# Patient Record
Sex: Male | Born: 1960 | Race: Black or African American | Hispanic: No | Marital: Single | Smoking: Former smoker
Health system: Southern US, Community
[De-identification: ages and names within clinical notes are randomized; demographics above are authoritative.]

## PROBLEM LIST (undated history)

## (undated) ENCOUNTER — Emergency Department (HOSPITAL_BASED_OUTPATIENT_CLINIC_OR_DEPARTMENT_OTHER): Payer: BLUE CROSS/BLUE SHIELD | Source: Home / Self Care

## (undated) ENCOUNTER — Ambulatory Visit (HOSPITAL_COMMUNITY): Admission: EM

## (undated) DIAGNOSIS — F329 Major depressive disorder, single episode, unspecified: Secondary | ICD-10-CM

## (undated) DIAGNOSIS — F1910 Other psychoactive substance abuse, uncomplicated: Secondary | ICD-10-CM

## (undated) DIAGNOSIS — M1990 Unspecified osteoarthritis, unspecified site: Secondary | ICD-10-CM

## (undated) DIAGNOSIS — F32A Depression, unspecified: Secondary | ICD-10-CM

## (undated) HISTORY — DX: Other psychoactive substance abuse, uncomplicated: F19.10

## (undated) HISTORY — DX: Major depressive disorder, single episode, unspecified: F32.9

## (undated) HISTORY — DX: Depression, unspecified: F32.A

---
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
## 2012-07-17 ENCOUNTER — Encounter (HOSPITAL_COMMUNITY): Payer: Self-pay | Admitting: Emergency Medicine

## 2012-07-17 ENCOUNTER — Emergency Department (HOSPITAL_COMMUNITY)
Admission: EM | Admit: 2012-07-17 | Discharge: 2012-07-18 | Disposition: A | Discharge status: Other Acute Inpatient Hospital | Payer: Self-pay | Attending: Emergency Medicine | Admitting: Emergency Medicine

## 2012-07-17 DIAGNOSIS — Z8739 Personal history of other diseases of the musculoskeletal system and connective tissue: Secondary | ICD-10-CM | POA: Insufficient documentation

## 2012-07-17 DIAGNOSIS — F3289 Other specified depressive episodes: Secondary | ICD-10-CM | POA: Insufficient documentation

## 2012-07-17 DIAGNOSIS — F17200 Nicotine dependence, unspecified, uncomplicated: Secondary | ICD-10-CM | POA: Insufficient documentation

## 2012-07-17 DIAGNOSIS — F329 Major depressive disorder, single episode, unspecified: Secondary | ICD-10-CM | POA: Insufficient documentation

## 2012-07-17 DIAGNOSIS — R45851 Suicidal ideations: Secondary | ICD-10-CM | POA: Insufficient documentation

## 2012-07-17 DIAGNOSIS — F1910 Other psychoactive substance abuse, uncomplicated: Secondary | ICD-10-CM | POA: Insufficient documentation

## 2012-07-17 HISTORY — DX: Unspecified osteoarthritis, unspecified site: M19.90

## 2012-07-17 LAB — RAPID URINE DRUG SCREEN, HOSP PERFORMED
Amphetamines: NOT DETECTED
Benzodiazepines: NOT DETECTED
Opiates: NOT DETECTED

## 2012-07-17 LAB — CBC
HCT: 43.1 % (ref 39.0–52.0)
Hemoglobin: 14.3 g/dL (ref 13.0–17.0)
MCHC: 33.2 g/dL (ref 30.0–36.0)
Platelets: 203 10*3/uL (ref 150–400)
RBC: 5.42 MIL/uL (ref 4.22–5.81)

## 2012-07-17 LAB — ACETAMINOPHEN LEVEL: Acetaminophen (Tylenol), Serum: <15 ug/mL (ref 10–30)

## 2012-07-17 LAB — COMPREHENSIVE METABOLIC PANEL
AST: 17 U/L (ref 0–37)
Albumin: 4 g/dL (ref 3.5–5.2)
Calcium: 9.4 mg/dL (ref 8.4–10.5)
Chloride: 103 mEq/L (ref 96–112)
Creatinine, Ser: 1.23 mg/dL (ref 0.50–1.35)
Total Protein: 7.6 g/dL (ref 6.0–8.3)

## 2012-07-17 NOTE — ED Notes (Signed)
PT. GIVEN [place] SANDWICH .

## 2012-07-17 NOTE — ED Notes (Signed)
PAPER SCRUBS /FOOTIES GIVEN TO PT, TOWELS/SOAP GIVEN TO PT. SECURITY PAGED TO WAND PT.  SITTER AT BEDSIDE .

## 2012-07-17 NOTE — ED Notes (Signed)
Pt given food and a Coke after discussing with [place], [place]

## 2012-07-17 NOTE — ED Notes (Signed)
Reports here for detox from ETOH, marijuana, and cocaine; reports feeling depressed and suicidal, lost his job; reports drinks gallon + of liquor per day; last drank today--this AM, last used cocaine yesterday, last used marijuana yesterday

## 2012-07-17 NOTE — ED Notes (Signed)
Notified Consulting civil engineer and [facility] that pt needs sitter

## 2012-07-18 ENCOUNTER — Inpatient Hospital Stay (HOSPITAL_COMMUNITY)
Admission: AD | Admit: 2012-07-18 | Discharge: 2012-07-25 | DRG: 897 | Disposition: A | Discharge status: Home / Self Care | Payer: Federal, State, Local not specified - Other | Source: Ambulatory Visit | Attending: Psychiatry | Admitting: Psychiatry

## 2012-07-18 DIAGNOSIS — M1990 Unspecified osteoarthritis, unspecified site: Secondary | ICD-10-CM | POA: Diagnosis present

## 2012-07-18 DIAGNOSIS — F1920 Other psychoactive substance dependence, uncomplicated: Secondary | ICD-10-CM | POA: Diagnosis present

## 2012-07-18 DIAGNOSIS — F1020 Alcohol dependence, uncomplicated: Secondary | ICD-10-CM | POA: Diagnosis present

## 2012-07-18 DIAGNOSIS — F329 Major depressive disorder, single episode, unspecified: Secondary | ICD-10-CM | POA: Diagnosis present

## 2012-07-18 DIAGNOSIS — F1419 Cocaine abuse with unspecified cocaine-induced disorder: Secondary | ICD-10-CM

## 2012-07-18 DIAGNOSIS — F1999 Other psychoactive substance use, unspecified with unspecified psychoactive substance-induced disorder: Principal | ICD-10-CM | POA: Diagnosis present

## 2012-07-18 MED ORDER — HYDROXYZINE HCL 25 MG PO TABS
25.0000 mg | ORAL_TABLET | Freq: Four times a day (QID) | ORAL | Status: AC | PRN
  Administered 2012-07-21: 25 mg via ORAL

## 2012-07-18 MED ORDER — LORAZEPAM 2 MG/ML IJ SOLN: 1.0000 mg | Freq: Four times a day (QID) | INTRAMUSCULAR | Status: DC | PRN

## 2012-07-18 MED ORDER — ALUM & MAG HYDROXIDE-SIMETH 200-200-20 MG/5ML PO SUSP: 30.0000 mL | ORAL | Status: DC | PRN

## 2012-07-18 MED ORDER — VITAMIN B-1 100 MG PO TABS
100.0000 mg | ORAL_TABLET | Freq: Every day | ORAL | Status: DC
  Administered 2012-07-19 – 2012-07-25 (×7): 100 mg via ORAL
  Filled 2012-07-18 (×8): qty 1

## 2012-07-18 MED ORDER — THIAMINE HCL 100 MG/ML IJ SOLN: 100.0000 mg | Freq: Every day | INTRAMUSCULAR | Status: DC

## 2012-07-18 MED ORDER — THIAMINE HCL 100 MG/ML IJ SOLN: 100.0000 mg | Freq: Once | INTRAMUSCULAR | Status: DC

## 2012-07-18 MED ORDER — FOLIC ACID 1 MG PO TABS
1.0000 mg | ORAL_TABLET | Freq: Every day | ORAL | Status: DC
  Administered 2012-07-18: 1 mg via ORAL
  Filled 2012-07-18 (×2): qty 1

## 2012-07-18 MED ORDER — ONDANSETRON 4 MG PO TBDP: 4.0000 mg | ORAL_TABLET | Freq: Four times a day (QID) | ORAL | Status: AC | PRN

## 2012-07-18 MED ORDER — POTASSIUM CHLORIDE CRYS ER 20 MEQ PO TBCR
20.0000 meq | EXTENDED_RELEASE_TABLET | Freq: Once | ORAL | Status: AC
  Administered 2012-07-18: 20 meq via ORAL
  Filled 2012-07-18: qty 1

## 2012-07-18 MED ORDER — LORAZEPAM 1 MG PO TABS: 1.0000 mg | ORAL_TABLET | Freq: Four times a day (QID) | ORAL | Status: DC | PRN

## 2012-07-18 MED ORDER — ADULT MULTIVITAMIN W/MINERALS CH
1.0000 | ORAL_TABLET | Freq: Every day | ORAL | Status: DC
  Administered 2012-07-18: 1 via ORAL
  Filled 2012-07-18: qty 1

## 2012-07-18 MED ORDER — LORAZEPAM 1 MG PO TABS
1.0000 mg | ORAL_TABLET | Freq: Three times a day (TID) | ORAL | Status: DC | PRN
  Filled 2012-07-18: qty 1

## 2012-07-18 MED ORDER — ZOLPIDEM TARTRATE 5 MG PO TABS: 5.0000 mg | ORAL_TABLET | Freq: Every evening | ORAL | Status: DC | PRN

## 2012-07-18 MED ORDER — CHLORDIAZEPOXIDE HCL 25 MG PO CAPS
25.0000 mg | ORAL_CAPSULE | Freq: Three times a day (TID) | ORAL | Status: AC
  Administered 2012-07-21 (×2): 25 mg via ORAL
  Filled 2012-07-18: qty 1

## 2012-07-18 MED ORDER — LOPERAMIDE HCL 2 MG PO CAPS: 2.0000 mg | ORAL_CAPSULE | ORAL | Status: AC | PRN

## 2012-07-18 MED ORDER — ADULT MULTIVITAMIN W/MINERALS CH
1.0000 | ORAL_TABLET | Freq: Every day | ORAL | Status: DC
  Administered 2012-07-19 – 2012-07-25 (×7): 1 via ORAL
  Filled 2012-07-18 (×8): qty 1

## 2012-07-18 MED ORDER — CHLORDIAZEPOXIDE HCL 25 MG PO CAPS
25.0000 mg | ORAL_CAPSULE | Freq: Every day | ORAL | Status: AC
  Administered 2012-07-23: 25 mg via ORAL
  Filled 2012-07-18: qty 1

## 2012-07-18 MED ORDER — VITAMIN B-1 100 MG PO TABS
100.0000 mg | ORAL_TABLET | Freq: Every day | ORAL | Status: DC
  Administered 2012-07-18: 100 mg via ORAL
  Filled 2012-07-18: qty 1

## 2012-07-18 MED ORDER — ONDANSETRON HCL 8 MG PO TABS: 4.0000 mg | ORAL_TABLET | Freq: Three times a day (TID) | ORAL | Status: DC | PRN

## 2012-07-18 MED ORDER — LORAZEPAM 1 MG PO TABS
0.0000 mg | ORAL_TABLET | Freq: Four times a day (QID) | ORAL | Status: DC
  Administered 2012-07-18 (×2): 1 mg via ORAL
  Filled 2012-07-18: qty 1

## 2012-07-18 MED ORDER — CHLORDIAZEPOXIDE HCL 25 MG PO CAPS
25.0000 mg | ORAL_CAPSULE | Freq: Four times a day (QID) | ORAL | Status: AC | PRN
  Administered 2012-07-20: 25 mg via ORAL
  Filled 2012-07-18: qty 1

## 2012-07-18 MED ORDER — ACETAMINOPHEN 325 MG PO TABS: 650.0000 mg | ORAL_TABLET | ORAL | Status: DC | PRN

## 2012-07-18 MED ORDER — CHLORDIAZEPOXIDE HCL 25 MG PO CAPS
25.0000 mg | ORAL_CAPSULE | ORAL | Status: AC
  Administered 2012-07-21 – 2012-07-22 (×2): 25 mg via ORAL
  Filled 2012-07-18 (×2): qty 1

## 2012-07-18 MED ORDER — LORAZEPAM 1 MG PO TABS: 0.0000 mg | ORAL_TABLET | Freq: Two times a day (BID) | ORAL | Status: DC

## 2012-07-18 MED ORDER — ACETAMINOPHEN 325 MG PO TABS
650.0000 mg | ORAL_TABLET | Freq: Four times a day (QID) | ORAL | Status: DC | PRN
  Administered 2012-07-23 – 2012-07-24 (×2): 650 mg via ORAL

## 2012-07-18 MED ORDER — CHLORDIAZEPOXIDE HCL 25 MG PO CAPS
25.0000 mg | ORAL_CAPSULE | Freq: Four times a day (QID) | ORAL | Status: AC
  Administered 2012-07-19 (×4): 25 mg via ORAL
  Filled 2012-07-18 (×3): qty 1

## 2012-07-18 MED ORDER — MAGNESIUM HYDROXIDE 400 MG/5ML PO SUSP: 30.0000 mL | Freq: Every day | ORAL | Status: DC | PRN

## 2012-07-18 MED ORDER — CHLORDIAZEPOXIDE HCL 25 MG PO CAPS
50.0000 mg | ORAL_CAPSULE | Freq: Once | ORAL | Status: DC
  Filled 2012-07-18 (×2): qty 1

## 2012-07-18 NOTE — ED Notes (Signed)
Gave pt a cup of ice and coke with peanut butter crackers and a bag of pretzels

## 2012-07-18 NOTE — ED Provider Notes (Signed)
History     CSN: [id number]  Arrival date & time 07/17/12  1758   First MD Initiated Contact with Patient 07/17/12 2359      Chief Complaint  Patient presents with  . Suicidal  . Detox     (Consider location/radiation/quality/duration/timing/severity/associated sxs/prior treatment) HPI Comments: Patient reports he has been very depressed, has been on a thirty day binge of alcohol, cocaine, and marijuana.  States over the past few days he began feeling suicidal and tried to kill himself by overdosing on drugs and alcohol.  States he feels physically drained and is a little disoriented (patient is from [place] and pulled off the highway to come here for help).  Has been having some pain with breathing for 3 days, which he associated with the excessive smoking he has been doing.  Also feels "shaky."  Denies CP, SOB, abdominal pain, N/V/D.  Patient states he is tired of having to repeat himself.  Per nurse, patient has been drinking over a gallon of liquor per day.  Last drink was this morning.    The history is provided by the patient.    Past Medical History  Diagnosis Date  . Arthritis     Past Surgical History  Procedure Date  . Tonsillectomy     History reviewed. No pertinent family history.  History  Substance Use Topics  . Smoking status: Current Some Day Smoker  . Smokeless tobacco: Not on file  . Alcohol Use: Yes     heavy ETOH      Review of Systems  Respiratory: Negative for cough and shortness of breath.   Cardiovascular: Negative for chest pain.  Gastrointestinal: Negative for nausea, vomiting, abdominal pain and diarrhea.  Psychiatric/Behavioral: Positive for suicidal ideas and dysphoric mood.    Allergies  Aspirin  Home Medications  No current outpatient prescriptions on file.  BP 122/78  Pulse 72  Temp 98.3 F (36.8 C) (Oral)  Resp 14  SpO2 98%  Physical Exam  Nursing note and vitals reviewed. Constitutional: He appears well-developed and  well-nourished. No distress.  HENT:  Head: Normocephalic and atraumatic.  Neck: Neck supple.  Cardiovascular: Normal rate, regular rhythm, normal heart sounds and intact distal pulses.   Pulmonary/Chest: Effort normal and breath sounds normal. No respiratory distress. He has no wheezes. He has no rales.  Abdominal: Soft. He exhibits no distension and no mass. There is no tenderness. There is no rebound and no guarding.  Musculoskeletal: He exhibits no edema.  Neurological: He is alert. He exhibits normal muscle tone.  Skin: He is not diaphoretic.  Psychiatric: His speech is normal. He is agitated. He exhibits a depressed mood.    ED Course  Procedures (including critical care time)  Labs Reviewed  COMPREHENSIVE METABOLIC PANEL - Abnormal; Notable for the following:    Potassium 3.3 (*)     Alkaline Phosphatase 37 (*)     GFR calc non Af Amer 67 (*)     GFR calc Af Amer 78 (*)     All other components within normal limits  URINE RAPID DRUG SCREEN (HOSP PERFORMED) - Abnormal; Notable for the following:    Cocaine POSITIVE (*)     All other components within normal limits  SALICYLATE LEVEL - Abnormal; Notable for the following:    Salicylate Lvl <2.0 (*)     All other components within normal limits  CBC  ETHANOL  ACETAMINOPHEN LEVEL   No results found.  1:17 AM Discussed patient with Berna Spare, ACT  team, who will see and assess for placement.   1. Polysubstance abuse   2. Suicidal ideation       MDM  Pt voluntarily in ED for assistance following 30 day binge of multiple substances and several days of suicidal ideation, with intent to kill himself by drug and alcohol overdose.  Psych holding orders and CIWA orders in place.  ACT is aware of patient.  Pt added to Dr I. Knapp's list at change of shift.          [place], [place] 07/18/12 [id number]

## 2012-07-18 NOTE — ED Notes (Signed)
Pt given 2 warm blankets, sitter at bedside

## 2012-07-18 NOTE — BHH Counselor (Signed)
Mason Williams, assessment counselor at [facility], submitted Pt for admission to [facility]. Consulted with Theodoro Kos, [facility] who said an appropriate bed is not currently available. Dr. Harvie Heck Readling reviewed clinical information and accepted Pt to the services of Dr. Antonietta Breach when a 300 hall bed is available.  [name] Patsy [place], LPC

## 2012-07-18 NOTE — Tx Team (Addendum)
Initial Interdisciplinary Treatment Plan  PATIENT STRENGTHS: (choose at least two) Ability for insight Average or above average intelligence Capable of independent living Communication skills Motivation for treatment/growth Religious Affiliation Supportive family/friends Work skills  PATIENT STRESSORS: Loss of Job* Occupational concerns Substance abuse   PROBLEM LIST: Problem List/Patient Goals Date to be addressed Date deferred Reason deferred Estimated date of resolution  Substance abuse 07/19/12     Depression 07/19/12     Suicidal Ideation 07/19/12                                          DISCHARGE CRITERIA:  Adequate post-discharge living arrangements Improved stabilization in mood, thinking, and/or behavior Motivation to continue treatment in a less acute level of care Need for constant or close observation no longer present Withdrawal symptoms are absent or subacute and managed without 24-hour nursing intervention  PRELIMINARY DISCHARGE PLAN: Attend aftercare/continuing care group Attend 12-step recovery group  PATIENT/FAMIILY INVOLVEMENT: This treatment plan has been presented to and reviewed with the patient, Mason Williams.  The patient and family have been given the opportunity to ask questions and make suggestions.  Juliann Pares 07/18/2012, 11:53 PM

## 2012-07-18 NOTE — BH Assessment (Signed)
[facility]HH Assessment Progress Note       Pt accepted to [facility] by Readling to [facility].  Support paperwork signed, facesheet updated and faxed to [facility], paperwork faxed to [facility], ED staff notified.  Pt will be transported by security. 

## 2012-07-18 NOTE — ED Notes (Signed)
Sitter for this pt informed me that she did not need a lunch break that she had "to leave at 3" for she had "somewhere to be"; RN notified

## 2012-07-18 NOTE — ED Notes (Signed)
E. WEST PA AT BEDSIDE EVALUATING PT. SITER AT BEDSIDE.

## 2012-07-18 NOTE — ED Provider Notes (Signed)
Medical screening examination/treatment/procedure(s) were performed by non-physician practitioner and as supervising physician I was immediately available for consultation/collaboration. Devoria Albe, MD, FACEP   Ward Givens, MD 07/18/12 [phone]

## 2012-07-18 NOTE — ED Provider Notes (Signed)
Pt has been accepted for eval at [facility].  Chart has been reviewed.  Plan transfer for further eval and management.  Tobin Chad, MD 07/18/12 2216

## 2012-07-18 NOTE — BH Assessment (Signed)
Assessment Note   Mason Williams is an 51 y.o. male. PT PRESENTS WITH INCREASE DEPRESSION & SUICIDAL THOUGHTS WITH A PLAN TO RUN CAR OFF ROAD OR POISON SELF. PT STATES HE IS FROM [place] BUT HAD LOST EVERYTHING INCLUDING HIS JOB. PT SAYS HE DECIDED TO RELOCATED & GO ON THE ROAD TO RELOCATE BUT STARTED FEELING GUILTY DUE TO HIS SUBSTANCE ABUSE. PT EXPRESSED THAT HE HAD BEEN SOBER FOR 6 MONTHS BUT NOW THAT HE HAS RELAPSED SEES NO REASON FOR HIM TO CONTINUE LIVING. PT ADMITS TO ABUSING ETOH, THC & COCAINE. PT IS UNABLE TO CONTRACT FOR SAFETY IF DISCHARGED & HAS BEEN REFERRED TO [facility]; PENDING DISPOSITION.  Axis I: Mood Disorder NOS, Substance Abuse and ALCOHOL DEPENDENCE; CANNIBUS & COCAINE ABUSE Axis II: Deferred Axis III:  Past Medical History  Diagnosis Date  . Arthritis    Axis IV: economic problems, occupational problems, other psychosocial or environmental problems, problems related to social environment and problems with primary support group Axis V: 11-20 some danger of hurting self or others possible OR occasionally fails to maintain minimal personal hygiene OR gross impairment in communication  Past Medical History:  Past Medical History  Diagnosis Date  . Arthritis     Past Surgical History  Procedure Date  . Tonsillectomy     Family History: History reviewed. No pertinent family history.  Social History:  reports that he has been smoking.  He does not have any smokeless tobacco history on file. He reports that he drinks alcohol. He reports that he uses illicit drugs (Cocaine and Marijuana).  Additional Social History:  Alcohol / Drug Use History of alcohol / drug use?: Yes Longest period of sobriety (when/how long): 6 MONTH Negative Consequences of Use: Personal relationships;Financial Substance #1 Name of Substance 1: COCAINE 1 - Age of First Use: 27 1 - Amount (size/oz): ANY AMT 1 - Frequency: DAILY 1 - Duration: ON GOING 1 - Last Use / Amount: 07/16/12 Substance  #2 Name of Substance 2: THC 2 - Age of First Use: 13 2 - Amount (size/oz): 1BLUNT 2 - Frequency: DAILY 2 - Duration: ON GOING 2 - Last Use / Amount: 07/15/12 Substance #3 Name of Substance 3: ETOH 3 - Age of First Use: 16 3 - Amount (size/oz): GALLON 3 - Frequency: DAILY 3 - Duration: ON GOING 3 - Last Use / Amount: 07/17/12  CIWA: CIWA-Ar BP: 120/79 mmHg Pulse Rate: 51  Nausea and Vomiting: mild nausea with no vomiting Tactile Disturbances: none Tremor: no tremor Auditory Disturbances: not present Paroxysmal Sweats: barely perceptible sweating, palms moist Visual Disturbances: not present Anxiety: mildly anxious Headache, Fullness in Head: very mild Agitation: normal activity Orientation and Clouding of Sensorium: oriented and can do serial additions CIWA-Ar Total: 4  COWS:    Allergies:  Allergies  Allergen Reactions  . Aspirin Hives    Home Medications:  (Not in a hospital admission)  OB/GYN Status:  No LMP for male patient.  General Assessment Data Location of Assessment: [facility] ED Living Arrangements: Alone Can pt return to current living arrangement?: Yes Admission Status: Voluntary Is patient capable of signing voluntary admission?: Yes Transfer from: Acute Hospital Referral Source: Self/Family/Friend     Risk to self Suicidal Ideation: Yes-Currently Present Suicidal Intent: Yes-Currently Present Is patient at risk for suicide?: Yes Suicidal Plan?: Yes-Currently Present Specify Current Suicidal Plan: DRIVE OFF THE HIGHWAY; POISON SELF Access to Means: Yes Specify Access to Suicidal Means: CAR What has been your use of drugs/alcohol within the  last 12 months?: THC, COCAINE, ETOH Previous Attempts/Gestures: No How many times?: 0  Other Self Harm Risks: SELF MEDICATING Triggers for Past Attempts: Unpredictable Intentional Self Injurious Behavior: None Family Suicide History: Yes Recent stressful life event(s): Turmoil (Comment);Job Loss;Financial  Problems;Loss (Comment) Persecutory voices/beliefs?: No Depression: Yes Depression Symptoms: Loss of interest in usual pleasures;Isolating;Feeling worthless/self pity;Guilt;Insomnia Substance abuse history and/or treatment for substance abuse?: Yes Suicide prevention information given to non-admitted patients: Not applicable  Risk to Others Homicidal Ideation: No Thoughts of Harm to Others: No Current Homicidal Intent: No Current Homicidal Plan: No Access to Homicidal Means: No Identified Victim: NA History of harm to others?: No Assessment of Violence: None Noted Violent Behavior Description: CALM, COOPERATIVE Does patient have access to weapons?: No Criminal Charges Pending?: No Does patient have a court date: No  Psychosis Hallucinations: None noted Delusions: None noted  Mental Status Report Appear/Hygiene: Improved Eye Contact: Poor Motor Activity: Freedom of movement Speech: Logical/coherent Level of Consciousness: Alert Mood: Depressed;Ambivalent;Anhedonia;Despair;Guilty Affect: Appropriate to circumstance;Depressed;Sad Anxiety Level: None Thought Processes: Coherent;Relevant Judgement: Impaired Orientation: Person;Place;Time;Situation Obsessive Compulsive Thoughts/Behaviors: None  Cognitive Functioning Concentration: Decreased Memory: Recent Intact;Remote Intact IQ: Average Insight: Poor Impulse Control: Poor Appetite: Fair Weight Loss: 0  Weight Gain: 0  Sleep: No Change Total Hours of Sleep: 6  Vegetative Symptoms: None  ADLScreening [facility] Assessment Services) Patient's cognitive ability adequate to safely complete daily activities?: Yes Patient able to express need for assistance with ADLs?: Yes Independently performs ADLs?: Yes (appropriate for developmental age)  Abuse/Neglect [facility]) Physical Abuse: Denies Verbal Abuse: Denies Sexual Abuse: Denies  Prior Inpatient Therapy Prior Inpatient Therapy: No Prior Therapy Dates: NA Prior Therapy  Facilty/Provider(s): NA Reason for Treatment: NA  Prior Outpatient Therapy Prior Outpatient Therapy: No Prior Therapy Dates: NA Prior Therapy Facilty/Provider(s): NA Reason for Treatment: NA  ADL Screening (condition at time of admission) Patient's cognitive ability adequate to safely complete daily activities?: Yes Patient able to express need for assistance with ADLs?: Yes Independently performs ADLs?: Yes (appropriate for developmental age)       Abuse/Neglect Assessment (Assessment to be complete while patient is alone) Physical Abuse: Denies Verbal Abuse: Denies Sexual Abuse: Denies          Additional Information 1:1 In Past 12 Months?: No CIRT Risk: No Elopement Risk: No Does patient have medical clearance?: Yes     Disposition:  Disposition Disposition of Patient: Inpatient treatment program;Referred to ([facility]; PENDING DISPOSITION) Type of inpatient treatment program: Adult  On Site Evaluation by:   Reviewed with Physician:     Waldron Session 07/18/2012 10:08 AM

## 2012-07-19 DIAGNOSIS — F1419 Cocaine abuse with unspecified cocaine-induced disorder: Secondary | ICD-10-CM | POA: Diagnosis present

## 2012-07-19 NOTE — Progress Notes (Signed)
Pt is a 51 year old AAM recently relocated from [place].  Pt has lost both of his jobs due to substance abuse and has been feeling suicidal since that time.  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  Pt states he had been using as much cocaine as he could get and drinking about a gallon of liquor daily.  Pt stated he had been sober for six months before relapsing on July 27th.  He is currently homeless.  Pt lists his friend Lorelle Formosa [phone] as his emergency contact, as well as his sister Garon Melander, whose phone number he will provide once he can see his cell phone.  His phone was not sent with other belongings from [facility], instead sent hours after Pt in bed.  He will need to get the phone out in the AM to get this number.

## 2012-07-19 NOTE — Progress Notes (Signed)
Patient ID: Mason Williams, male   DOB: 1961/03/08, 51 y.o.   MRN: [id number]   [facility] Notes:  (Counselor/Nursing/MHT/Case Management/Adjunct)  07/19/2012 1:15 PM  Type of Therapy:  Group Therapy, Dance/Movement Therapy   Participation Level:  Active  Participation Quality:  Appropriate and Sharing  Affect:  Appropriate  Cognitive:  Appropriate  Insight:  Good  Engagement in Group:  Good  Engagement in Therapy:  Good  Modes of Intervention:  Clarification, Problem-solving, Role-play, Socialization and Support  Summary of Progress/Problems: Therapist and group members discussed self-sabotage. Group members shared self-sabotaging behaviors and their battles with addiction. Therapist introduced the "Five Short Chapters" poem and group members shared their interpretations. Pt was very open and willing to share during group.     [name] 07/19/2012. 2:30 PM

## 2012-07-19 NOTE — H&P (Signed)
Psychiatric Admission Assessment Adult  Patient Identification:  Mason Williams Date of Evaluation:  07/19/2012 51 yo DAAM CC: Suicide attempt by tried to overdose on drugs and alcohol  UDS +cocaine no alcohol and liver functions are normal.  History of Present Illness: Reports a 30 day binge on alcohol THC and cocaine. Says the last few days he felt suicidal and drove from [place] with a plan to crash the car once it ran out of gas. 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 Story has many gaps and he chooses not to fill in. Wants to resettle here. Would check with law enforcement to verify that he he has no warrants Sharl Ma etc.     Past Psychiatric History: Denies prior inpatient   Substance Abuse History: Cocaine use since age 53  THC since age 41 Alcohol since age 37 Denies DT's or withdrawal seizures. Social History:    reports that he has been smoking.  He does not have any smokeless tobacco history on file. He reports that he drinks alcohol. He reports that he uses illicit drugs (Cocaine and Marijuana). Finished a psychology degree from [employer] . Married and divorced once has a daughter 1 and a son 7.Doesn't answer questions just asks more questions.  Family Psych History: Mother had depression .   Past Medical History:     Past Medical History  Diagnosis Date  . Arthritis        Past Surgical History  Procedure Date  . Tonsillectomy     Allergies:  Allergies  Allergen Reactions  . Aspirin Hives    Current Medications:  Prior to Admission medications   Not on File    Mental Status Examination/Evaluation: Objective:  Appearance: Fairly Groomed  Psychomotor Activity:  Normal  Eye Contact::  Good  Speech:  Normal Rate  Volume:  Normal  Mood:  No evidence for withdrawal   Affect:  Full Range  Thought Process:  Clear rational goal oriented- wants to resettle here  Orientation:  Full  Thought Content:  No  AVH/psychosis   Suicidal Thoughts:  No  Homicidal Thoughts:  No  Judgement:  Intact  Insight:  Good    DIAGNOSIS:    AXIS I Substance Abuse and Substance Induced Mood Disorder  AXIS II Deferred  AXIS III See medical history.  AXIS IV economic problems, educational problems, housing problems, occupational problems, other psychosocial or environmental problems, problems related to social environment and problems with primary support group  AXIS V 61-70 mild symptoms     Treatment Plan Summary: Admit for safety & stabilization No detox from  Cocaine  Case manager will help identify treatment programs for this [place] resident.

## 2012-07-19 NOTE — Progress Notes (Signed)
D- Patient is isolative to room but attended RN group with active participation. A- Rates depression at 8 and hopelessness at 9. Much remorse and guilt over recent relapse.  C/O tremors and chilling r/t withdrawal.  Behavior is appropriate and Yoniel is pleasant and cooperative. Compliant with scheduled medications and no prn's requested. R- Support and encouragement given.  Goals are to be "free from drugs".  Positive SI and contracts for safety.  No plan verbalized.  Continue current POC and evaluation of goals. Continue 15' checks for safety.

## 2012-07-19 NOTE — Progress Notes (Signed)
Psychoeducational Group Note  Date:  07/19/2012 Time:  1515  Group Topic/Focus:  Healthy Communication:   The focus of this group is to discuss communication, barriers to communication, as well as healthy ways to communicate with others.  Participation Level:  Minimal  Participation Quality:  Appropriate and Resistant  Affect:  Appropriate  Cognitive:  Alert and Appropriate  Insight:  Good  Engagement in Group:  Limited  Additional Comments:  Pt attended group discussing healthy communication and the five love languages.     Dalia Heading 07/19/2012, 4:32 PM

## 2012-07-19 NOTE — [facility] Suicide Risk Assessment (Signed)
Suicide Risk Assessment  Admission Assessment     Demographic factors:  Assessment Details Time of Assessment: Admission Information Obtained From: Patient Current Mental Status:  Current Mental Status: Suicidal ideation indicated by patient;Suicide plan;Belief that plan would result in death Loss Factors:  Loss Factors: Decrease in vocational status;Financial problems / change in socioeconomic status Historical Factors:  Historical Factors: Family history of mental illness or substance abuse;Domestic violence in family of origin Risk Reduction Factors:  Risk Reduction Factors: Sense of responsibility to family;Religious beliefs about death (Pt's faith in God)  CLINICAL FACTORS:   Depression:   Anhedonia Comorbid alcohol abuse/dependence Hopelessness Impulsivity Insomnia Recent sense of peace/wellbeing Alcohol/Substance Abuse/Dependencies Obsessive-Compulsive Disorder More than one psychiatric diagnosis Unstable or Poor Therapeutic Relationship Previous Psychiatric Diagnoses and Treatments  COGNITIVE FEATURES THAT CONTRIBUTE TO RISK:  Closed-mindedness Polarized thinking    SUICIDE RISK:   Moderate:  Frequent suicidal ideation with limited intensity, and duration, some specificity in terms of plans, no associated intent, good self-control, limited dysphoria/symptomatology, some risk factors present, and identifiable protective factors, including available and accessible social support.  PLAN OF CARE: Admitted for detox and crisis stabilization and depression with suicidal ideations and plan.   Geordie Nooney,JANARDHAHA R. 07/19/2012, 12:09 PM

## 2012-07-19 NOTE — Progress Notes (Signed)
Psychoeducational Group Note  Date:  07/19/2012 Time: 1030  Group Topic/Focus:  Identifying Needs:   The focus of this group is to help patients identify their personal needs that have been historically problematic and identify healthy behaviors to address their needs.  Participation Level:  Active  Participation Quality:  Appropriate and Attentive  Affect:  Blunted  Cognitive:  Alert  Insight:  Good  Engagement in Group:  Good  Additional Comments:    Cresenciano Lick 07/19/2012, 11:35 AM

## 2012-07-19 NOTE — Progress Notes (Signed)
Patient ID: Mason Williams, male   DOB: [birth year], 51 y.o.   MRN: [id number]  Pt. attended and participated in aftercare planning group. Pt. accepted information on suicide prevention, warning signs to look for with suicide and crisis line numbers to use. The pt. agreed to call crisis line numbers if having warning signs or having thoughts of suicide. Pt. listed their current anxiety level as 10 and their current depression level as 10. Pt shared that he is new to [facility] and that he doesn't really know anyone.

## 2012-07-20 DIAGNOSIS — F1994 Other psychoactive substance use, unspecified with psychoactive substance-induced mood disorder: Secondary | ICD-10-CM

## 2012-07-20 DIAGNOSIS — F1410 Cocaine abuse, uncomplicated: Secondary | ICD-10-CM

## 2012-07-20 NOTE — Progress Notes (Signed)
Mason Williams is out in milieu interacting with peers and attending groups with active participation.  A- Rates depression and hopelessness at 8. "Off and on" SI and contracts for safety.  Reports good sleep, appetite and energy.  Minimal s/s of detox has declined librium. Good insight into recovery process.  R- Support and encouragement given. Continue current POC and evaluation of treatment goals. Continue 15' checks for safety.

## 2012-07-20 NOTE — Progress Notes (Signed)
Psychoeducational Group Note  Date:  07/20/2012 Time:  0945  Group Topic/Focus:  Spirituality:   The focus of this group is to discuss how one's spirituality can aide in recovery.  Participation Level:  Active  Participation Quality:  Appropriate, Attentive, Sharing and Supportive  Affect:  Appropriate and Flat  Cognitive:  Alert and Appropriate  Insight:  Good  Engagement in Group:  Good  Additional Comments:   Pt attended non-denominational spirituality group, came in with 10 minutes left in group but did participate. Pt should good insight after reading an inspirational quote out loud to the group.     Dalia Heading 07/20/2012, 11:24 AM

## 2012-07-20 NOTE — Progress Notes (Signed)
Patient did attend the evening speaker AA meeting. Pt was alert and attentive. 

## 2012-07-20 NOTE — Progress Notes (Signed)
Patient ID: Wolf Boulay, male   DOB: December 22, 1960, 51 y.o.   MRN: [id number]   [facility] Group Notes:  (Counselor/Nursing/MHT/Case Management/Adjunct)  07/20/2012 1:15 PM  Type of Therapy:  Group Therapy, Dance/Movement Therapy   Participation Level:  Active  Participation Quality:  Resistant  Affect:  Appropriate  Cognitive:  Appropriate  Insight:  Limited  Engagement in Group:  Limited  Engagement in Therapy:  Limited  Modes of Intervention:  Clarification, Problem-solving, Role-play, Socialization and Support  Summary of Progress/Problems:  Therapist and group members discussed how to use humor as a coping skill while still understanding the seriousness of addiction. Therapist and group members also discussed how to use humor and universal concepts to explain our addictions and struggles to our supports. Pt was active during group but became irritated when he felt that there was no clear distinction in addiction. Pt felt that alcohol addiction was not the same as heroine or crack addictions.      [name] 07/20/2012. 3:03 PM

## 2012-07-20 NOTE — H&P (Signed)
Progress note was performed by physician extender and as a supervising MD, available for assistance near by during this rounds.  

## 2012-07-20 NOTE — Progress Notes (Signed)
Patient ID: Mason Williams, male   DOB: [birth year], 51 y.o.   MRN: [id number]  Pt. attended and participated in aftercare planning group. Pt. verbally accepted information on suicide prevention, warning signs to look for with suicide and crisis line numbers to use. Pt. listed their current anxiety level as 10 and their current depression level as 10. Pt shared that he is concerned about what is next for him and that he is ready to get his life back. Pt is interested in a half-way house/[place] house.

## 2012-07-20 NOTE — Progress Notes (Signed)
Patient ID: Mason Williams, male   DOB: 06/29/1961, 51 y.o.   MRN: [id number] Didn't attend group this evening, stated had been to groups all day and was too tired to go tonight, spent the evening in bed resting.  Remains flat , sad, worried about people in his life that he hasn't been there for when he should have been.

## 2012-07-20 NOTE — Progress Notes (Signed)
Psychoeducational Group Note  Date:  07/20/2012 Time:  1515  Group Topic/Focus:  Making Healthy Choices:   The focus of this group is to help patients identify negative/unhealthy choices they were using prior to admission and identify positive/healthier coping strategies to replace them upon discharge.  Participation Level:  Minimal  Participation Quality:  Appropriate, Inattentive and Resistant  Affect:  Appropriate and Blunted  Cognitive:  Alert and Appropriate  Insight:  Limited  Engagement in Group:  Limited  Additional Comments:   Pt attended group discussing the ten secrets of happiness.  Dalia Heading 07/20/2012, 4:15 PM

## 2012-07-20 NOTE — Progress Notes (Signed)
Psychoeducational Group Note  Date:  07/20/2012 Time:  1030  Group Topic/Focus:  Conflict Resolution:   The focus of this group is to discuss the conflict resolution process and how it may be used upon discharge.  Participation Level:  Active  Participation Quality:  Appropriate and Attentive  Affect:  Appropriate  Cognitive:  Alert and Appropriate  Insight:  Good  Engagement in Group:  Good  Additional Comments:    Cresenciano Lick 07/20/2012, 1:58 PM

## 2012-07-20 NOTE — Progress Notes (Addendum)
Patient ID: Mason Williams, male   DOB: 01-12-1961, 51 y.o.   MRN: [id number] D)  Has been somewhat isolative this evening, went to group but returned to his room and got into bed after snack.  Stated was still feeling depressed and upset with himself that he fell into this situation.  Talked about coming up here from [place] and not finding work and was then working for [employer].  Encouraged him to exercise and to be outside more, as well as being compliant with meds, and he laughed and told me to look at his website, [name], the [employer].  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 Talked about keeping himself in check, and staying close to the [place], and getting himself back on track.  Has been pleasant , appropriate, goal oriented. A) Meds as ordered,  Offer support and encouragement. Will continue to monitor q 15 minutes for safety, continue POC.  R)  Receptive, appreciative.
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
## 2012-07-20 NOTE — Progress Notes (Signed)
  Mason Williams is a 51 y.o. male [id number] [birth year]  07/18/2012 Principal Problem:  *Cocaine abuse with cocaine-induced disorder   Mental Status:    Subjective/Objective:    Filed Vitals:   07/20/12 0701  BP: 133/97  Pulse: 73  Temp:   Resp:     Lab Results:   BMET    Component Value Date/Time   NA 140 07/17/2012 1828   K 3.3* 07/17/2012 1828   CL 103 07/17/2012 1828   CO2 25 07/17/2012 1828   GLUCOSE 95 07/17/2012 1828   BUN 13 07/17/2012 1828   CREATININE 1.23 07/17/2012 1828   CALCIUM 9.4 07/17/2012 1828   GFRNONAA 67* 07/17/2012 1828   GFRAA 78* 07/17/2012 1828    Medications:  Scheduled:     . chlordiazePOXIDE  25 mg Oral QID   Followed by  . chlordiazePOXIDE  25 mg Oral TID   Followed by  . chlordiazePOXIDE  25 mg Oral BH-qamhs   Followed by  . chlordiazePOXIDE  25 mg Oral Daily  . chlordiazePOXIDE  50 mg Oral Once  . multivitamin with minerals  1 tablet Oral Daily  . thiamine  100 mg Intramuscular Once  . thiamine  100 mg Oral Daily     PRN Meds acetaminophen, alum & mag hydroxide-simeth, chlordiazePOXIDE, hydrOXYzine, loperamide, magnesium hydroxide, ondansetron   Marlea Gambill,MICKIE D. 07/20/2012    Mason Williams is a 51 y.o. male [id number] [birth year]  07/18/2012 Principal Problem:  *Cocaine abuse with cocaine-induced disorder   Mental Status:  Mood is baseline denies SI/HI/AVH.  Subjective/Objective:  Sad about his situation . Depressed that he has lost everything. Talking about an [employer].   Filed Vitals:   07/20/12 0701  BP: 133/97  Pulse: 73  Temp:   Resp:     Lab Results:   BMET    Component Value Date/Time   NA 140 07/17/2012 1828   K 3.3* 07/17/2012 1828   CL 103 07/17/2012 1828   CO2 25 07/17/2012 1828   GLUCOSE 95 07/17/2012 1828   BUN 13 07/17/2012 1828   CREATININE 1.23 07/17/2012 1828   CALCIUM 9.4 07/17/2012 1828   GFRNONAA 67* 07/17/2012 1828   GFRAA 78* 07/17/2012 1828    Medications:  Scheduled:     .  chlordiazePOXIDE  25 mg Oral QID   Followed by  . chlordiazePOXIDE  25 mg Oral TID   Followed by  . chlordiazePOXIDE  25 mg Oral BH-qamhs   Followed by  . chlordiazePOXIDE  25 mg Oral Daily  . chlordiazePOXIDE  50 mg Oral Once  . multivitamin with minerals  1 tablet Oral Daily  . thiamine  100 mg Intramuscular Once  . thiamine  100 mg Oral Daily     PRN Meds acetaminophen, alum & mag hydroxide-simeth, chlordiazePOXIDE, hydrOXYzine, loperamide, magnesium hydroxide, ondansetron  Plan: assess need for antidepressant tomorrow.    Taeko Schaffer,MICKIE D. 07/20/2012

## 2012-07-20 NOTE — Progress Notes (Signed)
Progress note was performed by physician extender and as a supervising MD, available for assistance near by during this rounds.  

## 2012-07-21 NOTE — Progress Notes (Signed)
Psychoeducational Group Note  Date:  07/21/2012 Time:  2000  Group Topic/Focus:  Wrap-Up Group:   The focus of this group is to help patients review their daily goal of treatment and discuss progress on daily workbooks.  Participation Level:  Active  Participation Quality:  Drowsy  Affect:    Cognitive:    Insight:    Engagement in Group:    Additional Comments:  Patient did attend AA meeting tonight.  Jamesyn Moorefield, Newton Pigg 07/21/2012, 8:58 PM

## 2012-07-21 NOTE — Progress Notes (Signed)
[facility] Group Notes:  (Counselor/Nursing/MHT/Case Management/Adjunct)   Type of Therapy:  Group Therapy at 1:15 to 2:30 PM  Participation Level:  Active  Participation Quality:  Appropriate  Affect:  Appropriate  Cognitive:  Appropriate  Insight:  Good  Engagement in Group:  Good  Engagement in Therapy:  Good  Modes of Intervention:  Clarification, Socialization and Support  Summary of Progress/Problems: Group discussion focused on what patient's see as their own obstacles to recovery. Mason Williams shared belief that "some friends will be difficult to deal with due to  His history of using.  I know this circuit, I use full out for moths and then have to remove myself from the environment to get clean and stay clean.  I cannot be around it or around people in NA/AA as they smoke cigarettes and how out and talk about all the war stories.  I need top be front and center in the church and people calling on me and offering help when I need it one brother to a brother."  Patient was open to discussion and attentive to others in group.     Mason Williams 07/21/2012, 2:55 PM

## 2012-07-21 NOTE — Progress Notes (Signed)
Patient ID: Mason Williams, male   DOB: [birth year], 51 y.o.   MRN: [id number] He has been up and to groups interacting with peers and staff. He has been worried about his court date today. He was informed that case manager would notify the court/alwyer today.

## 2012-07-21 NOTE — Progress Notes (Signed)
Read and reviewed. Discussed with team.  Available for immediate on site supervision.

## 2012-07-21 NOTE — Discharge Planning (Signed)
New patient attended AM group, good participation.  Originated in [place] and does not plan to return there.  Family is in [facility] and [place].  Plans to stay in this area.  Hopes to get into program that will give him the structure he needs while also allowing him flexibility to work.  Will talk to him about [facility]-see if it is a good fit.

## 2012-07-21 NOTE — Progress Notes (Signed)
Psychoeducational Group Note  Date:  07/21/2012 Time:  1100  Group Topic/Focus:  Wellness Toolbox:   The focus of this group is to discuss various aspects of wellness, balancing those aspects and exploring ways to increase the ability to experience wellness.  Patients will create a wellness toolbox for use upon discharge.  Participation Level:  Minimal  Participation Quality:  Inattentive and Resistant  Affect:  Flat and Labile  Cognitive:  Appropriate  Insight:  Limited  Engagement in Group:  Limited  Additional Comments:   Pt participation was minimum will attending group. Pt refused to find things that were positive and made them feel good. Pt was willing to write numbers and things that could be used if they were ever in a crisis again.  Sharyn Lull 07/21/2012, 1:24 PM

## 2012-07-21 NOTE — Progress Notes (Signed)
Patient ID: Mason Williams, male   DOB: Oct 22, 1961, 51 y.o.   MRN: [id number] Has been in bed this evening, missed group but stated was too tired to attend.  Later got up and was on the hall, got a snack, and requested to get his cell phone out of his locker for numbers.  RN who did his admission stated his belongings hadn't come over from the hospital with him, and that they had arrived in the middle of the night after he had gone to sleep.  Was escorted to his locker and cell phone obtained, but needed to be recharged.  Will obtain phone numbers in am.  Has been pleasant, intraspective, trying to get his focus back on his goals.  Will continue to monitor for safety, continue POC. Has been appreciative, states humbled by the experience, feels has gained much insight.

## 2012-07-21 NOTE — Progress Notes (Signed)
Psychoeducational Group Note  Date:  07/21/2012 Time: 1000  Group Topic/Focus:  Therapeutic Activity: Human Bingo  Participation Level: Did Not Attend  Participation Quality:  Not Applicable  Affect:  Not Applicable  Cognitive:  Not Applicable  Insight:  Not Applicable  Engagement in Group: Not Applicable  Additional Comments:  Pt did not attend group, remained in bed.  Karleen Hampshire Brittini 07/21/2012, 11:03 AM

## 2012-07-21 NOTE — Progress Notes (Signed)
[facility] MD Progress Note  07/21/2012 4:21 PM  S: "I came in here 3 days ago. Prior to my arrival here, I went into an Alcohol, Cocaine and marijuana binge after being sober x 6 months. I am very mad and disappointed in myself for acting so stupid. I had 3 separate jobs, bouncing from job to job. I am a personal fitness trainer. I was doing very well. One day, I finished work, decided to go do something stupid. I went on a drug bing. Using drugs paralyzes everything about my thoughts. This way, I don't feel and or worry about anything. I don't believe in AA meetings. Rather, when I get out of here, I am going back to church. I want a good christian life,wife and a good life".  Diagnosis:   Axis I: Cocaine abuse with cocaine-induced mood disorder Axis II: Deferred Axis III:  Past Medical History  Diagnosis Date  . Arthritis    Axis IV: economic problems, occupational problems, other psychosocial or environmental problems and substance abuse issues Axis V: 61-70 mild symptoms  ADL's:  Intact  Sleep: Good  Appetite:  Good  Suicidal Ideation: "No" Plan:  No Intent:  no Means:  no Homicidal Ideation: "No" Plan:  No Intent:  no Means:  no  AEB (as evidenced by): per patient's reports.  Mental Status Examination/Evaluation: Objective:  Appearance: Casual  Eye Contact::  Good  Speech:  Clear and Coherent  Volume:  Normal  Mood:  Saddened, regretful, feeling of guilt.   Affect:  Appropriate  Thought Process:  Coherent  Orientation:  Full  Thought Content:  Rumination  Suicidal Thoughts:  No  Homicidal Thoughts:  No  Memory:  Immediate;   Good Recent;   Good Remote;   Good  Judgement:  Poor  Insight:  Good  Psychomotor Activity:  Normal  Concentration:  Good  Recall:  Good  Akathisia:  No  Handed:  Right  AIMS (if indicated):     Assets:  Desire for Improvement  Sleep:  Number of Hours: 4.25    Vital Signs:Blood pressure 134/88, pulse 79, temperature 98 F (36.7 C),  temperature source Oral, resp. rate 18, height 6\' 1"  (1.854 m), weight 105.235 kg (232 lb). Current Medications: Current Facility-Administered Medications  Medication Dose Route Frequency Provider Last Rate Last Dose  . acetaminophen (TYLENOL) tablet 650 mg  650 mg Oral Q6H PRN Nehemiah Settle, MD      . alum & mag hydroxide-simeth (MAALOX/MYLANTA) 200-200-20 MG/5ML suspension 30 mL  30 mL Oral Q4H PRN Nehemiah Settle, MD      . chlordiazePOXIDE (LIBRIUM) capsule 25 mg  25 mg Oral Q6H PRN Nehemiah Settle, MD   25 mg at 07/20/12 2202  . chlordiazePOXIDE (LIBRIUM) capsule 25 mg  25 mg Oral QID Nehemiah Settle, MD   25 mg at 07/19/12 2306   Followed by  . chlordiazePOXIDE (LIBRIUM) capsule 25 mg  25 mg Oral TID Nehemiah Settle, MD   25 mg at 07/21/12 1204   Followed by  . chlordiazePOXIDE (LIBRIUM) capsule 25 mg  25 mg Oral BH-qamhs Nehemiah Settle, MD       Followed by  . chlordiazePOXIDE (LIBRIUM) capsule 25 mg  25 mg Oral Daily Nehemiah Settle, MD      . chlordiazePOXIDE (LIBRIUM) capsule 50 mg  50 mg Oral Once Nehemiah Settle, MD      . hydrOXYzine (ATARAX/VISTARIL) tablet 25 mg  25 mg Oral Q6H PRN Tomasita Crumble  Filbert Schilder, MD      . loperamide (IMODIUM) capsule 2-4 mg  2-4 mg Oral PRN Nehemiah Settle, MD      . magnesium hydroxide (MILK OF MAGNESIA) suspension 30 mL  30 mL Oral Daily PRN Nehemiah Settle, MD      . multivitamin with minerals tablet 1 tablet  1 tablet Oral Daily Nehemiah Settle, MD   1 tablet at 07/21/12 [phone]  . ondansetron (ZOFRAN-ODT) disintegrating tablet 4 mg  4 mg Oral Q6H PRN Nehemiah Settle, MD      . thiamine (B-1) injection 100 mg  100 mg Intramuscular Once Nehemiah Settle, MD      . thiamine (VITAMIN B-1) tablet 100 mg  100 mg Oral Daily Nehemiah Settle, MD   100 mg at 07/21/12 0813    Lab Results: No results found for this or  any previous visit (from the past 48 hour(s)).  Physical Findings: AIMS: Facial and Oral Movements Muscles of Facial Expression: None, normal Lips and Perioral Area: None, normal Jaw: None, normal Tongue: None, normal,Extremity Movements Upper (arms, wrists, hands, fingers): None, normal Lower (legs, knees, ankles, toes): None, normal, Trunk Movements Neck, shoulders, hips: None, normal, Overall Severity Severity of abnormal movements (highest score from questions above): None, normal Incapacitation due to abnormal movements: None, normal Patient's awareness of abnormal movements (rate only patient's report): No Awareness, Dental Status Current problems with teeth and/or dentures?: No Does patient usually wear dentures?: No  CIWA:  CIWA-Ar Total: 1  COWS:     Treatment Plan Summary: Daily contact with patient to assess and evaluate symptoms and progress in treatment Medication management  Plan: No changes made on the current treatment regimen. Continue current treatment plan.  Armandina Stammer I 07/21/2012, 4:21 PM

## 2012-07-21 NOTE — Progress Notes (Signed)
Patient did not attend this evenings speaker AA meeting. Pt was invited to group but pt had complaints of not feeling well and stayed in bed.

## 2012-07-22 DIAGNOSIS — F1220 Cannabis dependence, uncomplicated: Secondary | ICD-10-CM

## 2012-07-22 DIAGNOSIS — F1020 Alcohol dependence, uncomplicated: Secondary | ICD-10-CM

## 2012-07-22 DIAGNOSIS — F1420 Cocaine dependence, uncomplicated: Secondary | ICD-10-CM

## 2012-07-22 MED ORDER — TRAZODONE HCL 50 MG PO TABS
50.0000 mg | ORAL_TABLET | Freq: Every evening | ORAL | Status: DC | PRN
  Administered 2012-07-22 – 2012-07-24 (×3): 50 mg via ORAL
  Filled 2012-07-22 (×8): qty 1

## 2012-07-22 MED ORDER — TUBERCULIN PPD 5 UNIT/0.1ML ID SOLN: 5.0000 [IU] | Freq: Once | INTRADERMAL | Status: DC

## 2012-07-22 NOTE — Progress Notes (Signed)
[facility] MD Progress Note                                         07/22/2012    Mason Williams 01-16-1961    [id number] Hospital day #4  1. Cocaine abuse with cocaine-induced disorder   The patient was seen today and reports the following:  Sleep: good Appetite: pretty good  Mild (1-10) Severe  Depression (1-10): 9 Anxiety (1-10):   8 Hopelessness (1-10):  8-9  Suicidal Ideation: The patient reports on and off suicidal ideation. He can contract for safety. Plan: None Intent: None Means: None  Homicidal Ideation: The patient denies homicidal ideation. Plan: None Intent: None Means: None  Eye Contact:  Poor ( patient stayed in bed with sheet up over his face) General Appearance: ? Behavior:  resistant Motor Behavior: normal Speech: clear  Mental Status:  Orientation x 3 Level of Consciousness:   alert Mood: irritable Affect: congruent   Thought Process: linear Thought Content:  Denies AH/VH Perception: intact  Judgment: poor Insight: limited Cognition: at least average  VS: height is 6\' 1"  (1.854 m) and weight is 105.235 kg (232 lb). His oral temperature is 98.8 F (37.1 C). His blood pressure is 127/89 and his pulse is 67. His respiration is 18.  Current Medication:  . chlordiazePOXIDE  25 mg Oral TID   Followed by  . chlordiazePOXIDE  25 mg Oral BH-qamhs   Followed by  . chlordiazePOXIDE  25 mg Oral Daily  . chlordiazePOXIDE  50 mg Oral Once  . multivitamin with minerals  1 tablet Oral Daily  . thiamine  100 mg Intramuscular Once  . thiamine  100 mg Oral Daily  . tuberculin  5 Units Intradermal Once    Lab results: No results found for this or any previous visit (from the past 48 hour(s)). Group attendance: 3/5 ROS:    Constitutional: WDWN Adult in NAD   GI: Negative for N,V,D,C   Neuro: Negative for dizziness, blurred vision, visual changes, headaches   Resp: Negative for wheezing, SOB, cough   Cardio: Negative for CP, diaphoresis, fatigue   MSK:  Negative for joint pain, swelling, DROM, or ambulatory difficulties.  Time was spent with the patient discussing the current symptoms and the response to treatment. The patient was minimally cooperative with this examiner. He stayed in bed with the sheet pulled up over his face during the visit.  He stated he did not feel like talking at this time.  He did not know where he would be going to upon discharge and did not elaborate on current stressors.  Treatment Summary: 1. Continue medication management and stabilization. 2. Medication management to reduce current symptoms to base line and improve the patient's overall level of functioning 3. Treat health problems as indicated. 4. Develop treatment plan to decrease risk of relapse upon discharge and the need for     readmission. 5. Psycho-social education regarding relapse prevention and self care. 6. Health care follow up as needed for medical problems. 7. Restart home medications where appropriate.   Treatment Plan: 1. Continue current medications as written with no changes at this time. 2. Patient should be participating in groups to increase his response to treatment. 3. If patient is not willing to attend groups and participate it is recommended that he be discharged. Rona Ravens. Chrisette Man [facility] 07/22/2012

## 2012-07-22 NOTE — Progress Notes (Signed)
Psychoeducational Group Note  Date:  07/22/2012 Time:  1100  Group Topic/Focus:  Recovery Goals:   The focus of this group is to identify appropriate goals for recovery and establish a plan to achieve them.  Participation Level:  Minimal  Participation Quality:  Appropriate and Drowsy  Affect:  Appropriate  Cognitive:  Appropriate  Insight:  Good  Engagement in Group:  Limited  Additional Comments:  Pt participated in recovery goals group. Pt defined what recovery is. Pt also was given a personal recovery goals worksheet. Pt worked on the worksheet and wrote what changes needed to be made and set a goal toward the change. Pt was asked to make specific, measurable goals that would be realistic to accomplish. Pt shared when asked questions during group. Pt was drowsy throughout group.   Karleen Hampshire Brittini 07/22/2012, 1:05 PM

## 2012-07-22 NOTE — Treatment Plan (Signed)
Interdisciplinary Treatment Plan Update (Adult)  Date: 07/22/2012  Time Reviewed: 4:16 PM   Progress in Treatment: Attending groups: Yes Participating in groups: Yes Taking medication as prescribed: Yes Tolerating medication: Yes   Family/Significant other contact made: No  Patient understands diagnosis:  Yes  As evidenced by asking for help with mood stabilization, substance abuse Discussing patient identified problems/goals with staff:  Yes  See below Medical problems stabilized or resolved:  Yes Denies suicidal/homicidal ideation: Yes  In AM group Issues/concerns per patient self-inventory:  Yes  Depression and hopelessness are 8's Other:  New problem(s) identified: N/A  Reason for Continuation of Hospitalization: Depression Medication stabilization Withdrawal symptoms  Interventions implemented related to continuation of hospitalization: Librium taper  Assess for antidepressant medication  Encourage group attendance and participation  Additional comments:  Estimated length of stay: 2-3 days  Discharge Plan: see below New goal(s): N/A  Review of initial/current patient goals per problem list:   1.  Goal(s): Safely detox from alcohol  Met:  No  Target date:8/28  As evidenced AV:[id number] vitals, no withdrawal symptoms   2.  Goal (s):Stabilize mood  Met:  No  Target date:8/30  As evidenced BJ:YNWG will rate his depression at a 4 or less  3.  Goal(s):Get into a stable environment from here  Met:  No  Target date:8/30  As evidenced NF:AOZH is hoping to get into [facility] from here-has an phone interview tomorrow  4.  Goal(s):  Met:  No  Target date:  As evidenced by:  Attendees: Patient: Mason Williams  07/22/2012 4:16  Family:     Physician:  Lupe Carney 07/22/2012 4:16 PM   Nursing:    07/22/2012 4:16 PM   Case Manager:  Richelle Ito, LCSW 07/22/2012 4:16 PM   Counselor:   07/22/2012 4:16 PM   Other:     Other:     Other:     Other:      Scribe  for Treatment Team:   Ida Rogue, 07/22/2012 4:16 PM

## 2012-07-22 NOTE — Progress Notes (Signed)
[facility] Group Notes:  (Counselor/Nursing/MHT/Case Management/Adjunct)  Type of Therapy:  Group Therapy from 1:15 to 2:30 PM  Participation Level:  Did Not Attend  [name] 07/22/2012, 2:46 PM

## 2012-07-22 NOTE — Discharge Planning (Signed)
Mason Williams filled out Application to [facility].  Faxed in.

## 2012-07-22 NOTE — Progress Notes (Signed)
07/22/2012         Time: 1500      Group Topic/Focus: The focus of this group is on enhancing patients' problem solving skills, which involves identifying the problem, brainstorming solutions and choosing and trying a solution.  Participation Level: Minimal  Participation Quality: Attentive  Affect: Blunted  Cognitive: Alert  Additional Comments: None.    Daniele Yankowski 07/22/2012 3:41 PM

## 2012-07-22 NOTE — Progress Notes (Signed)
Patient has been up and interacting appropriately with staff and patients.  Requested something to help with sleep and received Trazadone.  He refused a PPD test stating that he has to have a chest x-ray instead of the skin test.  He denies SI/HI at this time. Medications given as ordered and emotional support provided.   Safety maintained on unit.

## 2012-07-22 NOTE — Progress Notes (Signed)
[facility] Group Notes:  (Counselor/Nursing/MHT/Case Management/Adjunct)  07/22/2012 2:16 PM  Type of Therapy:  Psychoeducational Skills  Participation Level:  Did Not Attend  Summary of Progress/Problems: Rahn did not attend Psychoeducational group that focused on using quality time with support systems/individuals to engage in healthy coping skills.    Wandra Scot 07/22/2012, 2:16 PM

## 2012-07-23 ENCOUNTER — Ambulatory Visit (HOSPITAL_COMMUNITY)
Admit: 2012-07-23 | Discharge: 2012-07-23 | Disposition: A | Discharge status: Home / Self Care | Payer: Self-pay | Attending: Physician Assistant | Admitting: Physician Assistant

## 2012-07-23 DIAGNOSIS — Z111 Encounter for screening for respiratory tuberculosis: Secondary | ICD-10-CM | POA: Insufficient documentation

## 2012-07-23 DIAGNOSIS — F17200 Nicotine dependence, unspecified, uncomplicated: Secondary | ICD-10-CM | POA: Insufficient documentation

## 2012-07-23 DIAGNOSIS — F329 Major depressive disorder, single episode, unspecified: Secondary | ICD-10-CM | POA: Diagnosis present

## 2012-07-23 MED ORDER — CITALOPRAM HYDROBROMIDE 20 MG PO TABS
20.0000 mg | ORAL_TABLET | Freq: Every day | ORAL | Status: DC
  Administered 2012-07-23 – 2012-07-25 (×3): 20 mg via ORAL
  Filled 2012-07-23 (×5): qty 1

## 2012-07-23 NOTE — Progress Notes (Signed)
Psychoeducational Group Note  Date:  07/23/2012 Time: 2000 Group Topic/Focus:  AA group  Participation Level:  Active  Participation Quality:  Appropriate  Affect:  Appropriate  Cognitive:  Appropriate  Insight:  Good  Engagement in Group:  Good  Additional Comments:    Mason Williams Patience 07/23/2012, 10:43 PM

## 2012-07-23 NOTE — Progress Notes (Signed)
Psychoeducational Group Note  Date:  07/23/2012 Time:  1100  Group Topic/Focus:  Personal Choices and Values:   The focus of this group is to help patients assess and explore the importance of values in their lives, how their values affect their decisions, how they express their values and what opposes their expression.  Participation Level:  Active  Participation Quality:  Appropriate, Attentive and Sharing  Affect:  Appropriate  Cognitive:  Appropriate  Insight:  Good  Engagement in Group:  Good  Additional Comments:  Pt participated in personal choices and values group. Pt stated what were negative and positive values and choices that have been made in past and present. Pt was given a worksheet on identifying values, where pt circled values on sheet that were important to pt. Pt also completed a worksheet on choosing a value-orientated life, which explains what values would like to be improved and how it could be improved. Pt was cooperative and appropriate during group and shared ideas and thoughts.   Karleen Hampshire Brittini 07/23/2012, 2:05 PM
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
## 2012-07-23 NOTE — Progress Notes (Signed)
Pt has been up for most groups.  He reported sleep well appetite good energy still low and his ability to pay attention is good.  He rated his depression a 9 hopelessness 7 and his anxiety a 8 on his self-inventory.  He plans to go to an [place] house on Friday.  Pt has denied any symptoms of withdrawal and continues to refuse the librium.  He stated,"I do not feel like I needed to take the librium" he went on to say that he didn't mind the clonidine.  His only compliant today has been headache which he received tylenol at 0759 which was helpful.

## 2012-07-23 NOTE — Discharge Planning (Signed)
Mason Williams interviewed with Mason Williams at [facility] this morning.  He returned to AM group and ranted about how she was against him from the beginning, how she was looking for reasons to say no to him, and declared that even if she offered him a bed, he would never go there.  Offered him a list of [employer] and also offered to call Mason Williams of [facility] for an interview.  Mason Williams accepted the offer, and interviewed with Mason Williams in the AM.  Mason Williams offered Mason Williams a bed. D/C on Friday at 10:00 AM.

## 2012-07-23 NOTE — Progress Notes (Signed)
[facility] Adult Inpatient Family/Significant Other Suicide Prevention Education  Suicide Prevention Education:  Contact Attempts: Friend , (name of family member/significant other) has been identified by the patient as the family member/significant other with whom the patient will be residing, and identified as the person(s) who will aid the patient in the event of a mental health crisis.  With written consent from the patient, two attempts were made to provide suicide prevention education, prior to and/or following the patient's discharge.  We were unsuccessful in providing suicide prevention education.  A suicide education pamphlet was given to the patient to share with family/significant other.  Date and time of first attempt: 07/21/2012 at 9:15 AM Date and time of second attempt: 07/23/2012 at 11:30 and 5:15 PM  Mason Williams 07/23/2012, 6:34 PM
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
## 2012-07-24 NOTE — Progress Notes (Signed)
[facility] Group Notes:  (Counselor/Nursing/MHT/Case Management/Adjunct)  07/24/2012 3:30 PM  Type of Therapy:  Group Therapy  Participation Level:  Active  Participation Quality:  Appropriate, Attentive, Monopolizing, Sharing and Supportive  Affect:  Appropriate  Cognitive:  Appropriate  Insight:  Good  Engagement in Group:  Good  Engagement in Therapy:  Good  Modes of Intervention:  Problem-solving, Support and exploration  Summary of Progress/Problems:Pt attended group therapy to process feeling about finding balance in one's life. Pt explored what balance means and looks like in their individual lives, what imbalance looks like and how to adjustment from life of addiction to a life of recovery. Pt shared about what worked for him during times of sobriety and was able to identify things that brought the imbalance in his life. Pt also shared that programs do not help him personally as he finds them counterproductive but has found church and men's groups to be helpful.    Kamica Florance L 07/24/2012, 3:30 PM

## 2012-07-24 NOTE — Progress Notes (Signed)
Patient resting quietly with eyes closed. Respirations even and unlabored. No distress noted, q 15 minute check continues as ordered for safety.

## 2012-07-24 NOTE — Progress Notes (Signed)
[facility] Adult Inpatient Family/Significant Other Suicide Prevention Education  Suicide Prevention Education:  Education Completed;  With Pt's friend Mrs. Alona Bene friend at [facility] [phone] has been identified by the patient as the family member/significant other with whom the patient will be residing, and identified as the person(s) who will aid the patient in the event of a mental health crisis (suicidal ideations/suicide attempt).  With written consent from the patient, the family member/significant other has been provided the following suicide prevention education, prior to the and/or following the discharge of the patient.  The suicide prevention education provided includes the following:  Suicide risk factors  Suicide prevention and interventions  National Suicide Hotline telephone number  [facility] assessment telephone number  [facility] Emergency Assistance 911  [facility] and/or Residential Mobile Crisis Unit telephone number  Request made of family/significant other to:  Remove weapons (e.g., guns, rifles, knives), all items previously/currently identified as safety concern.    Remove drugs/medications (over-the-counter, prescriptions, illicit drugs), all items previously/currently identified as a safety concern.  The family member/significant other verbalizes understanding of the suicide prevention education information provided.  The family member/significant other agrees to remove the items of safety concern listed above.  Mason Williams L 07/24/2012, 11:35 AM

## 2012-07-24 NOTE — Progress Notes (Addendum)
[facility] In Patient Progress Note  07/25/2012 8:37 AM Mason Williams 01-06-1961 [id number]  Diagnosis:  Axis I: Alcohol dependence, Cocaine dependence, THC dependence, SIMDO.  ADL's:  Intact  Sleep:  Yes,  AEB:  Appetite:  Suicidal Ideation: No suicidal ideation, no plan, no intent, no means.  Homicidal Ideation:  No homicidal ideation, no plan, no intent, no means.  Subjective:  height is 6\' 1"  (1.854 m) and weight is 105.235 kg (232 lb). His oral temperature is 98.1 F (36.7 C). His blood pressure is 127/89 and his pulse is 72. His respiration is 16.   Objective:   Mental Status: Orientation: x  General Appearance : Behavior:  Neat Eye Contact:  Good Motor Behavior:  Normal Speech:  Normal Level of Consciousness:  Alert Mood:  Euthymic Affect:  Appropriate Anxiety Level:  Minimal Thought Process:  Coherent Thought Content:  WNL Perception:  Normal Judgment:  Good Insight:  Present Cognition:  Orientation time, place and person Memory Immediate Concentration Yes Sleep:  Number of Hours: 6.25   Lab Results: No results found for this or any previous visit (from the past 48 hour(s)). Labs are reviewed. Physical Findings: AIMS: Facial and Oral Movements Muscles of Facial Expression: None, normal Lips and Perioral Area: None, normal Jaw: None, normal Tongue: None, normal,Extremity Movements Upper (arms, wrists, hands, fingers): None, normal Lower (legs, knees, ankles, toes): None, normal, Trunk Movements Neck, shoulders, hips: None, normal, Overall Severity Severity of abnormal movements (highest score from questions above): None, normal Incapacitation due to abnormal movements: None, normal Patient's awareness of abnormal movements (rate only patient's report): No Awareness, Dental Status Current problems with teeth and/or dentures?: No Does patient usually wear dentures?: No  CIWA:  CIWA-Ar Total: 0  COWS:     MEDICATION: . chlordiazePOXIDE  50 mg Oral Once  .  citalopram  20 mg Oral Daily  . multivitamin with minerals  1 tablet Oral Daily  . thiamine  100 mg Intramuscular Once  . thiamine  100 mg Oral Daily  . traZODone  50 mg Oral QHS,MR X 1  . tuberculin  5 Units Intradermal Once   Plan:  1. Initiated Celexa at 20mg  per patient's request for treatment of depression.  2. Continue current plan of care with Librium protocol.  3. Follow up on CXR for TB testing. CXR WNL   Lloyd Huger T. Darey Hershberger PAC 07/25/2012, 8:37 AM

## 2012-07-24 NOTE — Discharge Planning (Signed)
Mason Williams expressed excitement at going to [facility].  Requested gas money.  Will follow up at [facility] for psychotropic medication.

## 2012-07-24 NOTE — Progress Notes (Signed)
Psychoeducational Group Note  Date:  07/24/2012 Time:  1100  Group Topic/Focus:  Rediscovering Joy:   The focus of this group is to explore various ways to relieve stress in a positive manner.  Participation Level: Did Not Attend  Participation Quality:  Not Applicable  Affect:  Not Applicable  Cognitive:  Not Applicable  Insight:  Not Applicable  Engagement in Group: Not Applicable  Additional Comments:  Pt did not attend group, pt remained in group.  Karleen Hampshire Brittini 07/24/2012, 1:45 PM

## 2012-07-24 NOTE — Progress Notes (Signed)
D:  Patient up and attending groups today.  Has been quiet, but pleasant.  Rates depression and hopelessness at 7 out of 10.  Tolerating medications well.  Patient was scheduled for PPD read today, but did not have PPD placed.  States he had a chest xray done.   A:  Medications given as scheduled.  Encouraged patient to be up and interacting in groups.   R:  No withdrawal symptoms noted at this time.  Pleasant and cooperative with staff and peers.

## 2012-07-24 NOTE — Progress Notes (Signed)
07/24/2012         Time: 1500      Group Topic/Focus: The focus of this group is on enhancing the patient's understanding of leisure, barriers to leisure, and the importance of engaging in positive leisure activities upon discharge for improved total health.  Participation Level: Did not attend  Participation Quality: Not Applicable  Affect: Not Applicable  Cognitive: Not Applicable   Additional Comments: Patient in bed.   Mason Williams 07/24/2012 3:37 PM  

## 2012-07-24 NOTE — Progress Notes (Signed)
D:Pt appeared calm, soft spoken, sat in the day room at the beginning of the shift. His mood and affect flat and depressed. He endorsed suicide thoughts, but contracted for safety.  A: Pt encouraged and supported. Bedtime medication Trazodone given as ordered. Encouraged patient to return for the second dose of trazodone if needed. R: Pt received medications without difficulty.Q 15 minute check continues as ordered to maintain safety.

## 2012-07-25 MED ORDER — ADULT MULTIVITAMIN W/MINERALS CH: 1.0000 | ORAL_TABLET | Freq: Every day | ORAL | Status: DC

## 2012-07-25 MED ORDER — CITALOPRAM HYDROBROMIDE 20 MG PO TABS: 20.0000 mg | ORAL_TABLET | Freq: Every day | ORAL | Status: DC

## 2012-07-25 MED ORDER — TRAZODONE HCL 50 MG PO TABS: 50.0000 mg | ORAL_TABLET | Freq: Every evening | ORAL | Status: DC | PRN
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
## 2012-07-25 NOTE — Progress Notes (Signed)
Patient discharged from the unit.  He denies SI/HI and A/V .  Rn reviewed follow up/discharge information and appointments, discharge medications/prescriptions with him with voiced understanding given.  He obtained all belongings from his room and out of his locker prior to leaving.  He was pleasant upon discharge and thankful to staff regarding his care here.

## 2012-07-29 NOTE — Progress Notes (Signed)
Patient Discharge Instructions:  After Visit Summary (AVS):   Faxed to:  07/29/2012 Psychiatric Admission Assessment Note:   Faxed to:  07/29/2012 Suicide Risk Assessment - Discharge Assessment:   Faxed to:  07/29/2012 Faxed/Sent to the Next Level Care provider:  07/29/2012  Faxed to [facility] @ [phone]  Heloise Purpura Eduard Clos, 07/29/2012, 5:06 PM
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
## 2012-12-06 ENCOUNTER — Emergency Department (HOSPITAL_COMMUNITY)
Admission: EM | Admit: 2012-12-06 | Discharge: 2012-12-06 | Disposition: A | Discharge status: Home / Self Care | Payer: No Typology Code available for payment source | Attending: Emergency Medicine | Admitting: Emergency Medicine

## 2012-12-06 ENCOUNTER — Emergency Department (HOSPITAL_COMMUNITY): Payer: No Typology Code available for payment source

## 2012-12-06 ENCOUNTER — Encounter (HOSPITAL_COMMUNITY): Payer: Self-pay | Admitting: Emergency Medicine

## 2012-12-06 DIAGNOSIS — M549 Dorsalgia, unspecified: Secondary | ICD-10-CM

## 2012-12-06 DIAGNOSIS — S0993XA Unspecified injury of face, initial encounter: Secondary | ICD-10-CM | POA: Insufficient documentation

## 2012-12-06 DIAGNOSIS — R209 Unspecified disturbances of skin sensation: Secondary | ICD-10-CM | POA: Insufficient documentation

## 2012-12-06 DIAGNOSIS — IMO0002 Reserved for concepts with insufficient information to code with codable children: Secondary | ICD-10-CM | POA: Insufficient documentation

## 2012-12-06 DIAGNOSIS — S199XXA Unspecified injury of neck, initial encounter: Secondary | ICD-10-CM | POA: Insufficient documentation

## 2012-12-06 DIAGNOSIS — Z8739 Personal history of other diseases of the musculoskeletal system and connective tissue: Secondary | ICD-10-CM | POA: Insufficient documentation

## 2012-12-06 DIAGNOSIS — Y9389 Activity, other specified: Secondary | ICD-10-CM | POA: Insufficient documentation

## 2012-12-06 DIAGNOSIS — Y92410 Unspecified street and highway as the place of occurrence of the external cause: Secondary | ICD-10-CM | POA: Insufficient documentation

## 2012-12-06 DIAGNOSIS — S8990XA Unspecified injury of unspecified lower leg, initial encounter: Secondary | ICD-10-CM | POA: Insufficient documentation

## 2012-12-06 DIAGNOSIS — Z87891 Personal history of nicotine dependence: Secondary | ICD-10-CM | POA: Insufficient documentation

## 2012-12-06 DIAGNOSIS — S99919A Unspecified injury of unspecified ankle, initial encounter: Secondary | ICD-10-CM | POA: Insufficient documentation

## 2012-12-06 MED ORDER — METHOCARBAMOL 500 MG PO TABS: 500.0000 mg | ORAL_TABLET | Freq: Two times a day (BID) | ORAL | Status: DC
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
## 2012-12-06 NOTE — ED Notes (Signed)
Patient transported to X-ray 

## 2012-12-06 NOTE — ED Notes (Signed)
Pt ambulatory to exam room with steady gait.  

## 2012-12-08 NOTE — ED Provider Notes (Signed)
Medical screening examination/treatment/procedure(s) were performed by non-physician practitioner and as supervising physician I was immediately available for consultation/collaboration.  Cattie Tineo, MD 12/08/12 2104 

## 2014-10-13 ENCOUNTER — Ambulatory Visit: Payer: Self-pay | Admitting: Family

## 2014-11-02 ENCOUNTER — Ambulatory Visit: Payer: Self-pay | Admitting: Family

## 2014-11-10 ENCOUNTER — Ambulatory Visit (INDEPENDENT_AMBULATORY_CARE_PROVIDER_SITE_OTHER): Payer: BC Managed Care – PPO | Admitting: Family

## 2014-11-10 ENCOUNTER — Ambulatory Visit (INDEPENDENT_AMBULATORY_CARE_PROVIDER_SITE_OTHER): Payer: BC Managed Care – PPO | Admitting: *Deleted

## 2014-11-10 ENCOUNTER — Encounter: Payer: Self-pay | Admitting: Family

## 2014-11-10 VITALS — BP 110/78 | HR 57 | Temp 98.7°F | Resp 16 | Ht 72.0 in | Wt 220.0 lb

## 2014-11-10 DIAGNOSIS — Z23 Encounter for immunization: Secondary | ICD-10-CM

## 2014-11-10 DIAGNOSIS — Z0000 Encounter for general adult medical examination without abnormal findings: Secondary | ICD-10-CM

## 2014-11-10 LAB — CBC WITH DIFFERENTIAL/PLATELET
BASOS PCT: 1.1 % (ref 0.0–3.0)
Basophils Absolute: 0 10*3/uL (ref 0.0–0.1)
EOS PCT: 2.5 % (ref 0.0–5.0)
Eosinophils Absolute: 0.1 10*3/uL (ref 0.0–0.7)
HEMATOCRIT: 43.9 % (ref 39.0–52.0)
HEMOGLOBIN: 14.3 g/dL (ref 13.0–17.0)
LYMPHS ABS: 1.4 10*3/uL (ref 0.7–4.0)
Lymphocytes Relative: 40.3 % (ref 12.0–46.0)
MCHC: 32.6 g/dL (ref 30.0–36.0)
MCV: 79.5 fl (ref 78.0–100.0)
MONO ABS: 0.3 10*3/uL (ref 0.1–1.0)
MONOS PCT: 8.9 % (ref 3.0–12.0)
Neutro Abs: 1.7 10*3/uL (ref 1.4–7.7)
Neutrophils Relative %: 47.2 % (ref 43.0–77.0)
Platelets: 211 10*3/uL (ref 150.0–400.0)
RBC: 5.52 Mil/uL (ref 4.22–5.81)
RDW: 13.7 % (ref 11.5–15.5)
WBC: 3.6 10*3/uL — AB (ref 4.0–10.5)

## 2014-11-10 LAB — LIPID PANEL
CHOLESTEROL: 239 mg/dL — AB (ref 0–200)
HDL: 69.5 mg/dL (ref >39.00)
LDL CALC: 145 mg/dL — AB (ref 0–99)
NonHDL: 169.5
Total CHOL/HDL Ratio: 3
Triglycerides: 123 mg/dL (ref 0.0–149.0)
VLDL: 24.6 mg/dL (ref 0.0–40.0)

## 2014-11-10 LAB — URINALYSIS, ROUTINE W REFLEX MICROSCOPIC
BILIRUBIN URINE: NEGATIVE
HGB URINE DIPSTICK: NEGATIVE
Ketones, ur: NEGATIVE
Leukocytes, UA: NEGATIVE
NITRITE: NEGATIVE
Specific Gravity, Urine: 1.025 (ref 1.000–1.030)
TOTAL PROTEIN, URINE-UPE24: NEGATIVE
URINE GLUCOSE: NEGATIVE
UROBILINOGEN UA: 1 (ref 0.0–1.0)
pH: 6.5 (ref 5.0–8.0)

## 2014-11-10 LAB — PSA: PSA: 0.68 ng/mL (ref 0.10–4.00)

## 2014-11-10 LAB — BASIC METABOLIC PANEL
BUN: 19 mg/dL (ref 6–23)
CHLORIDE: 109 meq/L (ref 96–112)
CO2: 23 mEq/L (ref 19–32)
Calcium: 9.5 mg/dL (ref 8.4–10.5)
Creatinine, Ser: 1.3 mg/dL (ref 0.4–1.5)
GFR: 72.54 mL/min (ref >60.00)
Glucose, Bld: 87 mg/dL (ref 70–99)
POTASSIUM: 4.1 meq/L (ref 3.5–5.1)
SODIUM: 146 meq/L — AB (ref 135–145)

## 2014-11-10 LAB — HEPATIC FUNCTION PANEL
ALT: 26 U/L (ref 0–53)
AST: 34 U/L (ref 0–37)
Albumin: 4.7 g/dL (ref 3.5–5.2)
Alkaline Phosphatase: 25 U/L — ABNORMAL LOW (ref 39–117)
Bilirubin, Direct: 0.1 mg/dL (ref 0.0–0.3)
TOTAL PROTEIN: 7.9 g/dL (ref 6.0–8.3)
Total Bilirubin: 0.4 mg/dL (ref 0.2–1.2)

## 2014-11-10 LAB — TSH: TSH: 0.98 u[IU]/mL (ref 0.35–4.50)

## 2014-11-10 NOTE — Patient Instructions (Signed)
Please complete lab work prior to leaving. You will be contacted about your referral for colonoscopy. Keep up the good work with healthy diet and exercise. Follow up in 1 year, sooner if problems/concerns. Welcome to [facility]!
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
## 2014-11-10 NOTE — Progress Notes (Signed)
Pre visit review using our clinic review tool, if applicable. No additional management support is needed unless otherwise documented below in the visit note. 

## 2014-11-11 ENCOUNTER — Encounter: Payer: Self-pay | Admitting: Family

## 2014-11-14 ENCOUNTER — Encounter: Payer: Self-pay | Admitting: Family

## 2014-11-14 DIAGNOSIS — Z0000 Encounter for general adult medical examination without abnormal findings: Secondary | ICD-10-CM | POA: Insufficient documentation

## 2014-11-14 NOTE — Assessment & Plan Note (Signed)
Flu shot today.  Obtain routine lab work including PSA (discussed pros/cons).  Continue healthy diet, exercise.

## 2015-04-08 ENCOUNTER — Ambulatory Visit (INDEPENDENT_AMBULATORY_CARE_PROVIDER_SITE_OTHER): Payer: BLUE CROSS/BLUE SHIELD | Admitting: Family

## 2015-04-08 ENCOUNTER — Encounter: Payer: Self-pay | Admitting: Family

## 2015-04-08 VITALS — BP 120/88 | HR 56 | Temp 97.9°F | Resp 16 | Ht 72.0 in | Wt 228.4 lb

## 2015-04-08 DIAGNOSIS — M25561 Pain in right knee: Secondary | ICD-10-CM | POA: Insufficient documentation

## 2015-04-08 DIAGNOSIS — M25562 Pain in left knee: Secondary | ICD-10-CM

## 2015-04-08 DIAGNOSIS — M25569 Pain in unspecified knee: Secondary | ICD-10-CM | POA: Diagnosis not present

## 2015-04-08 MED ORDER — MELOXICAM 7.5 MG PO TABS: 7.5000 mg | ORAL_TABLET | Freq: Every day | ORAL | Status: DC

## 2015-04-08 NOTE — Progress Notes (Signed)
   Subjective:    Patient ID: Mason Williams, male    DOB: 12/01/1960, 54 y.o.   MRN: [id number]  HPI   Mr.Parlow is a 54 yr old male who presents today with chief complaint of bilateral knee pain.  He attributes this pain to running. Reports that he runs 4 miles a day.  Reports pain bilateral knees medially, L>R.    Review of Systems Past Medical History  Diagnosis Date  . Arthritis   . Depression     admitted to psych 2013 for suicide ideation/drug detox  . Drug abuse     History   Social History  . Marital Status: Single    Spouse Name: N/A  . Number of Children: N/A  . Years of Education: N/A   Occupational History  . Not on file.   Social History Main Topics  . Smoking status: Former Games developermoker  . Smokeless tobacco: Not on file  . Alcohol Use: No     Comment: former alcoholic  . Drug Use: No     Comment: former  . Sexual Activity: Not on file   Other Topics Concern  . 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 Tonsillectomy  1977    Family History  Problem Relation Age of Onset  . Diabetes Mother   . Heart disease Maternal Grandmother     Allergies  Allergen Reactions  . Aspirin Hives  . Penicillins Rash    No current outpatient prescriptions on file prior to visit.   No current facility-administered medications on file prior to visit.    BP 120/88 mmHg  Pulse 56  Temp(Src) 97.9 F (36.6 C) (Oral)  Resp 16  Ht 6' (1.829 m)  Wt 228 lb 6.4 oz (103.602 kg)  BMI 30.97 kg/m2  SpO2 99%       Objective:   Physical Exam  Constitutional: He is oriented to person, place, and time. He appears well-developed and well-nourished. No  distress.  Cardiovascular: Normal rate and regular rhythm.   No murmur heard. Pulmonary/Chest: Effort normal and breath sounds normal. No respiratory distress. He has no wheezes. He has no rales. He exhibits no tenderness.  Musculoskeletal:  Bilateral knees without swelling or effusion.  Non-tender to touch, no warmth  Neurological: He is alert and oriented to person, place, and time.  Psychiatric: He has a normal mood and affect. His behavior is normal. Judgment and thought content normal.          Assessment & Plan:

## 2015-04-08 NOTE — Patient Instructions (Addendum)
Start meloxicam once daily as needed for knee pain. You will be contact about your referral to sports medicine.  Please let us know if you have not heard back within 1 week about your referral. Follow up as needed.

## 2015-04-08 NOTE — Progress Notes (Signed)
Pre visit review using our clinic review tool, if applicable. No additional management support is needed unless otherwise documented below in the visit note. 

## 2015-04-08 NOTE — Assessment & Plan Note (Signed)
Will rx with meloxicam.   Refer to sports medicine. We discussed purchasing new running shoes as well.

## 2015-04-15 ENCOUNTER — Ambulatory Visit (HOSPITAL_BASED_OUTPATIENT_CLINIC_OR_DEPARTMENT_OTHER)
Admission: RE | Admit: 2015-04-15 | Discharge: 2015-04-15 | Disposition: A | Discharge status: Home / Self Care | Payer: BLUE CROSS/BLUE SHIELD | Source: Ambulatory Visit | Attending: Family Medicine | Admitting: Family Medicine

## 2015-04-15 ENCOUNTER — Ambulatory Visit (INDEPENDENT_AMBULATORY_CARE_PROVIDER_SITE_OTHER): Payer: BLUE CROSS/BLUE SHIELD | Admitting: Family Medicine

## 2015-04-15 ENCOUNTER — Encounter: Payer: Self-pay | Admitting: Family Medicine

## 2015-04-15 VITALS — BP 157/99 | HR 56 | Ht 73.0 in | Wt 220.0 lb

## 2015-04-15 DIAGNOSIS — M25561 Pain in right knee: Secondary | ICD-10-CM | POA: Diagnosis present

## 2015-04-15 DIAGNOSIS — M25562 Pain in left knee: Secondary | ICD-10-CM | POA: Insufficient documentation

## 2015-04-15 MED ORDER — METHYLPREDNISOLONE ACETATE 40 MG/ML IJ SUSP
40.0000 mg | Freq: Once | INTRAMUSCULAR | Status: AC
  Administered 2015-04-15: 40 mg via INTRA_ARTICULAR

## 2015-04-15 NOTE — Patient Instructions (Signed)
You have arthritis of your knees. These are the four classes of medicine you can take for this: Tylenol 500mg  1-2 tabs three times a day for pain. Ibuprofen 600mg  three times a day with food OR Aleve 1-2 tabs twice a day with food Glucosamine sulfate 750mg  twice a day is a supplement that may help. Capsaicin, aspercreme, tiger balm, or biofreeze topically up to four times a day may also help with pain. Cortisone injections are an option - you were given these today. If cortisone injections do not help, there are different types of shots that may help but they take longer to take effect. It's important that you continue to stay active. Straight leg raises, knee extensions 3 sets of 10 once a day (add ankle weight if these become too easy). Consider physical therapy to strengthen muscles around the joint that hurts to take pressure off of the joint itself. Shoe inserts with good arch support may be helpful. Heat or ice 15 minutes at a time 3-4 times a day as needed to help with pain. Water aerobics and cycling with low resistance are the best two types of exercise for arthritis. Follow up with me in 1 month for reevaluation.

## 2015-04-19 NOTE — Progress Notes (Signed)
PCP and referred by: Lemont Fillers'SULLIVAN,MELISSA S., NP  Subjective:   HPI: Patient is a 54 y.o. male here for bilateral knee pain.  Patient reports he's had bilateral knee pain for a while but worse past 3 weeks. Pain medial. 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 Also on feet at work for [employer]. No catching, locking, giving out. Tried massage, tiger balm, bracing, hot tub. + stiffness. Left worse than right currently.  Past Medical History  Diagnosis Date  . Arthritis   . Depression     admitted to psych 2013 for suicide ideation/drug detox  . Drug abuse     Current Outpatient Prescriptions on File Prior to Visit  Medication Sig Dispense Refill  . meloxicam (MOBIC) 7.5 MG tablet Take 1 tablet (7.5 mg total) by mouth daily. 14 tablet 0   No current facility-administered medications on file prior to visit.    Past Surgical History  Procedure Laterality Date  . Tonsillectomy  1977    Allergies  Allergen Reactions  . Aspirin Hives  . Penicillins Rash    History   Social History  . Marital Status: Single    Spouse Name: N/A  . Number of Children: N/A  . Years of Education: N/A   Occupational History  . Not on file.   Social History Main Topics  . Smoking status: Former Games developermoker  . Smokeless tobacco: Not on file  . Alcohol Use: No     Comment: former alcoholic  . Drug Use: No     Comment: former  . Sexual Activity: Not on file   Other Topics Concern  . 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 Diabetes Mother   . Heart disease Maternal Grandmother     BP 157/99 mmHg  Pulse 56  Ht 6\' 1"  (1.854 m)  Wt 220 lb (99.791 kg)   BMI 29.03 kg/m2  Review of Systems: See HPI above.    Objective:  Physical Exam:  Gen: NAD  Bilateral knees: No gross deformity, ecchymoses, effusions. TTP medial joint lines only. FROM. Negative ant/post drawers. Negative valgus/varus testing. Negative lachmanns. Negative mcmurrays, apleys, patellar apprehension. NV intact distally.    Assessment & Plan:  1. Bilateral knee pain - radiographs consistent with mild DJD.  Exam otherwise reassuring.  Discussed tylenol, nsaids, glucosamine, topical medications.  Shown home exercises to do daily.  Heat/ice as needed.  Injection given into more symptomatic left knee.  F/u in 1 month.  After informed written consent, patient was seated on exam table. Left knee was prepped with alcohol swab and utilizing anteromedial approach, patient's left knee was injected intraarticularly with 3:1 marcaine: depomedrol. Patient tolerated the procedure well without immediate complications.

## 2015-04-19 NOTE — Assessment & Plan Note (Signed)
radiographs consistent with mild DJD.  Exam otherwise reassuring.  Discussed tylenol, nsaids, glucosamine, topical medications.  Shown home exercises to do daily.  Heat/ice as needed.  Injection given into more symptomatic left knee.  F/u in 1 month.  After informed written consent, patient was seated on exam table. Left knee was prepped with alcohol swab and utilizing anteromedial approach, patient's left knee was injected intraarticularly with 3:1 marcaine: depomedrol. Patient tolerated the procedure well without immediate complications.

## 2015-04-26 ENCOUNTER — Ambulatory Visit: Payer: BLUE CROSS/BLUE SHIELD | Admitting: Family Medicine

## 2015-04-28 ENCOUNTER — Ambulatory Visit: Payer: BLUE CROSS/BLUE SHIELD | Admitting: Family Medicine

## 2015-05-02 ENCOUNTER — Ambulatory Visit: Payer: BLUE CROSS/BLUE SHIELD | Admitting: Family Medicine

## 2015-05-06 ENCOUNTER — Encounter: Payer: Self-pay | Admitting: Family Medicine

## 2015-05-06 ENCOUNTER — Ambulatory Visit (INDEPENDENT_AMBULATORY_CARE_PROVIDER_SITE_OTHER): Payer: BLUE CROSS/BLUE SHIELD | Admitting: Family Medicine

## 2015-05-06 VITALS — BP 131/89 | HR 56 | Ht 74.0 in | Wt 214.0 lb

## 2015-05-06 DIAGNOSIS — M25561 Pain in right knee: Secondary | ICD-10-CM | POA: Diagnosis not present

## 2015-05-06 DIAGNOSIS — M25562 Pain in left knee: Secondary | ICD-10-CM

## 2015-05-06 MED ORDER — METHYLPREDNISOLONE ACETATE 40 MG/ML IJ SUSP
40.0000 mg | Freq: Once | INTRAMUSCULAR | Status: AC
  Administered 2015-05-06: 40 mg via INTRA_ARTICULAR

## 2015-05-06 NOTE — Patient Instructions (Signed)
You have arthritis of your knees. Call us in 1-2 weeks to let us know how you're doing - would consider an MRI of your left knee if this still isn't improving. These are the four classes of medicine you can take for this: Tylenol 500mg  1-2 tabs three times a day for pain. Ibuprofen 600mg  three times a day with food OR Aleve 1-2 tabs twice a day with food Glucosamine sulfate 750mg  twice a day is a supplement that may help. Capsaicin, aspercreme, tiger balm, or biofreeze topically up to four times a day may also help with pain. It's important that you continue to stay active. Straight leg raises, knee extensions 3 sets of 10 once a day (add ankle weight if these become too easy). Consider physical therapy to strengthen muscles around the joint that hurts to take pressure off of the joint itself. Shoe inserts with good arch support may be helpful. Heat or ice 15 minutes at a time 3-4 times a day as needed to help with pain. Water aerobics and cycling with low resistance are the best two types of exercise for arthritis.

## 2015-05-09 NOTE — Assessment & Plan Note (Signed)
radiographs consistent with mild DJD.  Exam otherwise reassuring.  Discussed tylenol, nsaids, glucosamine, topical medications.  Shown home exercises to do daily.  Heat/ice as needed.  Injection given into right knee today.  As left knee not improving we discussed considering MRI of this knee if not improving still after another 1-2 weeks to assess for concurrent meniscus tear.  After informed written consent, patient was seated on exam table. Right knee was prepped with alcohol swab and utilizing anteromedial approach, patient's right knee was injected intraarticularly with 3:1 marcaine: depomedrol. Patient tolerated the procedure well without immediate complications.

## 2015-05-09 NOTE — Progress Notes (Signed)
PCP and referred by: Lemont Fillers., NP  Subjective:   HPI: Patient is a 54 y.o. male here for bilateral knee pain.  5/20: Patient reports he's had bilateral knee pain for a while but worse past 3 weeks. Pain medial. 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 Also on feet at work for [employer]. No catching, locking, giving out. Tried massage, tiger balm, bracing, hot tub. + stiffness. Left worse than right currently.  6/10: Patient returns for injection into right knee (we were out of depomedrol at last visit to do both knees). States left knee still hurting despite injection as well.  Past Medical History  Diagnosis Date  . Arthritis   . Depression     admitted to psych 2013 for suicide ideation/drug detox  . Drug abuse     Current Outpatient Prescriptions on File Prior to Visit  Medication Sig Dispense Refill  . meloxicam (MOBIC) 7.5 MG tablet Take 1 tablet (7.5 mg total) by mouth daily. 14 tablet 0   No current facility-administered medications on file prior to visit.    Past Surgical History  Procedure Laterality Date  . Tonsillectomy  1977    Allergies  Allergen Reactions  . Aspirin Hives  . Penicillins Rash    History   Social History  . Marital Status: Single    Spouse Name: N/A  . Number of Children: N/A  . Years of Education: N/A   Occupational History  . Not on file.   Social History Main Topics  . Smoking status: Former Games developer  . Smokeless tobacco: Not on file  . Alcohol Use: No     Comment: former alcoholic  . Drug Use: No     Comment: former  . Sexual Activity: Not on file   Other Topics Concern  . 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 Diabetes Mother   . Heart disease Maternal Grandmother     BP 131/89 mmHg  Pulse 56  Ht 6\' 2"  (1.88 m)  Wt 214 lb (97.07 kg)  BMI 27.46 kg/m2  Review of Systems: See HPI above.    Objective:  Physical Exam:  Gen: NAD  Exam not repeated today. Bilateral knees: No gross deformity, ecchymoses, effusions. TTP medial joint lines only. FROM. Negative ant/post drawers. Negative valgus/varus testing. Negative lachmanns. Negative mcmurrays, apleys, patellar apprehension. NV intact distally.    Assessment & Plan:  1. Bilateral knee pain - radiographs consistent with mild DJD.  Exam otherwise reassuring.  Discussed tylenol, nsaids, glucosamine, topical medications.  Shown home exercises to do daily.  Heat/ice as needed.  Injection given into right knee today.  As left knee not improving we discussed considering MRI of this knee if not improving still after another 1-2 weeks to assess for concurrent meniscus tear.  After informed written consent, patient was seated on exam table. Right knee was prepped with alcohol swab and utilizing anteromedial approach, patient's right knee was injected intraarticularly with 3:1 marcaine: depomedrol. Patient tolerated the procedure well without immediate complications.

## 2015-06-30 ENCOUNTER — Telehealth: Payer: Self-pay | Admitting: Family

## 2015-06-30 DIAGNOSIS — Z0000 Encounter for general adult medical examination without abnormal findings: Secondary | ICD-10-CM

## 2015-06-30 NOTE — Telephone Encounter (Signed)
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 Patient states the form is requesting patient to have a EKG, HIV test etc. Patient would like to drop off form 07/01/15, patient would like to know if an appointment is necessary. Please advise

## 2015-07-01 NOTE — Telephone Encounter (Signed)
EKG was performed by [place] 10/2014 and I do not see that he has had HIV screen with [place].  Please advise?

## 2015-07-01 NOTE — Telephone Encounter (Signed)
Left message for pt to return my call. Pt will need to schedule a lab appt for HIV screen.  Future lab order entered. He may drop off form at time he completes lab order.

## 2015-07-01 NOTE — Telephone Encounter (Signed)
Ok to add HIV screen- and drop off form. Thanks.

## 2015-07-04 ENCOUNTER — Other Ambulatory Visit: Payer: BLUE CROSS/BLUE SHIELD

## 2015-07-04 DIAGNOSIS — Z0000 Encounter for general adult medical examination without abnormal findings: Secondary | ICD-10-CM

## 2015-07-04 NOTE — Telephone Encounter (Signed)
Pt came in to the lab today. States he is needing more than just HIV. Form retrieved and added Hep B surface antigen and Hep C antibody. Tests ordered.  Form also states pt needs CXR, MRI Ophthalmologist exam and Dr Clearance.  Advised pt he will need CPE to complete additional orders and obtain medical clearance. Form in blue folder on CMA desk.

## 2015-07-04 NOTE — Telephone Encounter (Signed)
Pt scheduled follow up for clearance on 07/08/15. He is not due for CPE until 11/14/15.

## 2015-07-05 LAB — HEPATITIS C ANTIBODY: HCV Ab: NEGATIVE

## 2015-07-05 LAB — HIV ANTIBODY (ROUTINE TESTING W REFLEX): HIV: NONREACTIVE

## 2015-07-05 LAB — HEPATITIS B SURFACE ANTIGEN: Hepatitis B Surface Ag: NEGATIVE

## 2015-07-08 ENCOUNTER — Ambulatory Visit (HOSPITAL_BASED_OUTPATIENT_CLINIC_OR_DEPARTMENT_OTHER)
Admission: RE | Admit: 2015-07-08 | Discharge: 2015-07-08 | Disposition: A | Discharge status: Home / Self Care | Payer: BLUE CROSS/BLUE SHIELD | Source: Ambulatory Visit | Attending: Family | Admitting: Family

## 2015-07-08 ENCOUNTER — Telehealth: Payer: Self-pay | Admitting: Family

## 2015-07-08 ENCOUNTER — Ambulatory Visit (INDEPENDENT_AMBULATORY_CARE_PROVIDER_SITE_OTHER): Payer: BLUE CROSS/BLUE SHIELD | Admitting: Family

## 2015-07-08 ENCOUNTER — Encounter: Payer: Self-pay | Admitting: Family

## 2015-07-08 VITALS — BP 136/100 | HR 77 | Temp 97.7°F | Ht 74.0 in | Wt 213.8 lb

## 2015-07-08 DIAGNOSIS — Z0000 Encounter for general adult medical examination without abnormal findings: Secondary | ICD-10-CM | POA: Insufficient documentation

## 2015-07-08 DIAGNOSIS — IMO0001 Reserved for inherently not codable concepts without codable children: Secondary | ICD-10-CM | POA: Insufficient documentation

## 2015-07-08 DIAGNOSIS — R030 Elevated blood-pressure reading, without diagnosis of hypertension: Secondary | ICD-10-CM

## 2015-07-08 DIAGNOSIS — Z025 Encounter for examination for participation in sport: Secondary | ICD-10-CM | POA: Diagnosis not present

## 2015-07-08 MED ORDER — MELOXICAM 7.5 MG PO TABS: 7.5000 mg | ORAL_TABLET | Freq: Every day | ORAL | Status: DC

## 2015-07-08 MED ORDER — HYDROCHLOROTHIAZIDE 25 MG PO TABS: 25.0000 mg | ORAL_TABLET | Freq: Every day | ORAL | Status: DC

## 2015-07-08 NOTE — Assessment & Plan Note (Signed)
Add hctz 

## 2015-07-08 NOTE — Telephone Encounter (Signed)
Please advise pt that I reviewed his BP from today along with his two previous blood pressures. Overall his BP has been running higher than it should. I would recommend that he start hctz (a mild water pill) once daily in the AM.  Schedule nurse visit on Tuesday- we will repeat his blood pressure and fax his form that day after we see his follow up BP.  Also he will need bmet  In 1 week please.

## 2015-07-08 NOTE — Telephone Encounter (Signed)
Pt notified and made aware.  He agrees with plan.  Nurse visit and lab appt scheduled.  Future lab order placed.

## 2015-07-08 NOTE — Progress Notes (Addendum)
   Subjective:    Patient ID: Mason Williams, male    DOB: [birth year], 54 y.o.   MRN: [id number]  HPI  Mr. Mason Williams is a 54 yr old male who presents today for medical clearance. 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   He has no complaints today.   Review of Systems Past Medical History  Diagnosis Date  . Arthritis   . Depression     admitted to psych 2013 for suicide ideation/drug detox  . Drug abuse     Social History   Social History  . Marital Status: Single    Spouse Name: N/A  . Number of Children: N/A  . Years of Education: N/A   Occupational History  . pro boxer    Social History Main Topics  . Smoking status: Former Games developer  . Smokeless tobacco: Not on file  . Alcohol Use: No     Comment: former alcoholic  . Drug Use: No     Comment: former  . Sexual Activity: Not on file   Other Topics Concern  . 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 Tonsillectomy  1977    Family History  Problem Relation Age of Onset  . Diabetes Mother   . Heart disease Maternal Grandmother   . Diabetes Maternal Grandfather     Allergies  Allergen Reactions  . Aspirin Hives  . Penicillins Rash    No current outpatient prescriptions on file prior to visit.   No current facility-administered medications on file prior to visit.    BP 136/100 mmHg  Pulse 77  Temp(Src) 97.7 F (36.5 C) (Oral)  Ht  (1.88 m)  Wt 213 lb 12.8 oz (96.979 kg)  BMI 27.44 kg/m2  SpO2 98%       Objective:   Physical Exam  Constitutional: He is oriented to person, place, and time. He appears well-developed and well-nourished. No distress.  Eyes: Pupils are  equal, round, and reactive to light.  Cardiovascular: Normal rate and regular rhythm.   No murmur heard. Pulmonary/Chest: Effort normal and breath sounds normal. No respiratory distress. He has no wheezes. He has no rales. He exhibits no tenderness.  Neurological: He is alert and oriented to person, place, and time. He has normal reflexes. No cranial nerve deficit. He exhibits normal muscle tone. Coordination normal.  Reflex Scores:      Patellar reflexes are 2+ on the right side and 2+ on the left side. Psychiatric: He has a normal mood and affect. His behavior is normal. Judgment and thought content normal.          Assessment & Plan:   BP Readings from Last 3 Encounters:  07/08/15 136/100  05/06/15 131/89  04/15/15 157/99

## 2015-07-08 NOTE — Assessment & Plan Note (Signed)
Recommended testing includes HIV, Hep B, Hep C testing which he has completed and all of which are negative.  EKG (this was performed back in December with his physical and notes sinus bradycardia).  CXR- will be performed today.  MRI- I told patient that I do not believe that this will be covered by his insurance in the absence of neurological findings. He has a normal exam today.

## 2015-07-08 NOTE — Patient Instructions (Addendum)
Please complete chest x ray on the first floor. We will let you know when we fax your form. Follow up in January for your annual physical. Sooner if problems/concerns.

## 2015-07-08 NOTE — Addendum Note (Signed)
Addended by: Sandford Craze on: 07/08/2015 03:28 PM   Modules accepted: Orders, Level of Service

## 2015-07-12 ENCOUNTER — Ambulatory Visit (INDEPENDENT_AMBULATORY_CARE_PROVIDER_SITE_OTHER): Payer: BLUE CROSS/BLUE SHIELD | Admitting: Family

## 2015-07-12 ENCOUNTER — Other Ambulatory Visit (INDEPENDENT_AMBULATORY_CARE_PROVIDER_SITE_OTHER): Payer: BLUE CROSS/BLUE SHIELD

## 2015-07-12 ENCOUNTER — Telehealth: Payer: Self-pay | Admitting: Family

## 2015-07-12 VITALS — BP 132/86 | HR 67

## 2015-07-12 DIAGNOSIS — E876 Hypokalemia: Secondary | ICD-10-CM

## 2015-07-12 DIAGNOSIS — R030 Elevated blood-pressure reading, without diagnosis of hypertension: Secondary | ICD-10-CM | POA: Diagnosis not present

## 2015-07-12 DIAGNOSIS — IMO0001 Reserved for inherently not codable concepts without codable children: Secondary | ICD-10-CM

## 2015-07-12 LAB — BASIC METABOLIC PANEL
BUN: 22 mg/dL (ref 6–23)
CALCIUM: 10 mg/dL (ref 8.4–10.5)
CHLORIDE: 99 meq/L (ref 96–112)
CO2: 30 mEq/L (ref 19–32)
CREATININE: 1.35 mg/dL (ref 0.40–1.50)
GFR: 71.12 mL/min (ref >60.00)
Glucose, Bld: 105 mg/dL — ABNORMAL HIGH (ref 70–99)
Potassium: 3.4 mEq/L — ABNORMAL LOW (ref 3.5–5.1)
Sodium: 138 mEq/L (ref 135–145)

## 2015-07-12 MED ORDER — POTASSIUM CHLORIDE CRYS ER 20 MEQ PO TBCR: 20.0000 meq | EXTENDED_RELEASE_TABLET | Freq: Every day | ORAL | Status: DC

## 2015-07-12 NOTE — Telephone Encounter (Signed)
Notified pt and he voices understanding. Lab appt scheduled for 07/19/15 at 7:30am and future order entered. Form faxed to alcohol law enforcement branch at [phone] and copy mailed to pt at his request.

## 2015-07-12 NOTE — Addendum Note (Signed)
Addended by: Sandford Craze on: 07/12/2015 09:21 PM   Modules accepted: Level of Service

## 2015-07-12 NOTE — Progress Notes (Addendum)
Reviewed. BP is improved.continue HCTZ.  BMET today reveals hypokalemia. Add Kdur, follow up in 1 week for repeat bmet. See phone note.

## 2015-07-12 NOTE — Progress Notes (Signed)
Pre visit review using our clinic review tool, if applicable. No additional management support is needed unless otherwise documented below in the visit note.  Patient presents for BP follow-up per phone note 07/08/15.   BP was 132/86 with HR 67.    Per patient, PCP has forms to fax to boxing commission regarding blood pressure readings.

## 2015-07-12 NOTE — Telephone Encounter (Signed)
Please let pt know that his potassium is a bit low. BP looks better.  I would like him to add Kdur once daily. Repeat bmet in 1 week.  I have completed his Boxing clearance form, just waiting for Dr. Abner Greenspan to sign so we can fax.

## 2015-07-13 NOTE — Addendum Note (Signed)
Addended by: Sandford Craze on: 07/13/2015 11:06 AM   Modules accepted: Level of Service

## 2015-07-15 ENCOUNTER — Other Ambulatory Visit: Payer: Self-pay

## 2015-07-19 ENCOUNTER — Other Ambulatory Visit (INDEPENDENT_AMBULATORY_CARE_PROVIDER_SITE_OTHER): Payer: BLUE CROSS/BLUE SHIELD

## 2015-07-19 ENCOUNTER — Ambulatory Visit: Payer: BLUE CROSS/BLUE SHIELD

## 2015-07-19 DIAGNOSIS — E876 Hypokalemia: Secondary | ICD-10-CM | POA: Diagnosis not present

## 2015-07-19 LAB — BASIC METABOLIC PANEL
BUN: 16 mg/dL (ref 6–23)
CHLORIDE: 103 meq/L (ref 96–112)
CO2: 31 mEq/L (ref 19–32)
CREATININE: 1.19 mg/dL (ref 0.40–1.50)
Calcium: 9.5 mg/dL (ref 8.4–10.5)
GFR: 82.26 mL/min (ref >60.00)
Glucose, Bld: 98 mg/dL (ref 70–99)
POTASSIUM: 3.8 meq/L (ref 3.5–5.1)
SODIUM: 140 meq/L (ref 135–145)

## 2015-07-21 NOTE — Progress Notes (Unsigned)
Opened in Error.

## 2015-08-09 ENCOUNTER — Telehealth: Payer: Self-pay | Admitting: Family

## 2015-08-09 DIAGNOSIS — G4700 Insomnia, unspecified: Secondary | ICD-10-CM

## 2015-08-09 DIAGNOSIS — Z025 Encounter for examination for participation in sport: Secondary | ICD-10-CM

## 2015-08-09 NOTE — Telephone Encounter (Signed)
Dr. Abner Greenspan, would you be comfortable signing letter as below?  If not, just let me know. Thanks.

## 2015-08-09 NOTE — Telephone Encounter (Signed)
I do not know enough about boxing to be able to make this statement. Might recommend a sports medicine consult? I do not have direct contact with this patient

## 2015-08-09 NOTE — Telephone Encounter (Signed)
Caller name: Delaney Meigs from [employer] back number: [phone]  Reason for call: in addition requesting a letter stating ie. 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 Representative states it needs to come from a medical doctor

## 2015-08-09 NOTE — Telephone Encounter (Signed)
Advised patient MD is in clinic and he was calling checking on the status of below request.

## 2015-08-09 NOTE — Telephone Encounter (Signed)
Below results copied and awaiting fax.  Please advise re: letter and should it be signed by Dr Abner Greenspan?

## 2015-08-09 NOTE — Telephone Encounter (Signed)
Attempted to reach pt and mailbox is full. Will try later.

## 2015-08-09 NOTE — Telephone Encounter (Signed)
Spoke with Mason Williams in Sports Medicine. She will speak with MD and let us know if this is something they will assess. Notified pt. He was very upset that this "has not already been taken care of". Advised him we thought everything was complete until they are requiring letter that MRI is not required for him to compete. Advised him supervising MD is not comfortable giving him approval without MRI. Awaiting call from sports medicine.

## 2015-08-09 NOTE — Telephone Encounter (Signed)
Please let pt know that I would like him to meet with sports medicine doctor for further recommendations. I have placed the referral.

## 2015-08-09 NOTE — Telephone Encounter (Signed)
Pt called stating the boxing commission needs [place] to send them copies of his recent labs, EKG, and Chest xray. We have consent already signed in pt chart but all documents were not sent in with the form that was completed. Please fax to [phone], Attn: Boxing Commission

## 2015-08-10 ENCOUNTER — Telehealth: Payer: Self-pay | Admitting: Family

## 2015-08-10 NOTE — Telephone Encounter (Signed)
Results have been faxed to below # on 08/09/15. Per verbal from PCP, she spoke with Ayesha Mohair, sports medicine and he states that [place] requires pt to get clearance from neurologist and she is awaiting call from Dr Tat.

## 2015-08-10 NOTE — Telephone Encounter (Signed)
Spoke to pt. Advised him that sports med has recommended neuro exam/clearance per [place] boxing [employer] regulations. Will arrange.  Pt verbalizes understanding.

## 2015-08-11 NOTE — Telephone Encounter (Signed)
Please contact pt and let him know that Sports medicine is unwilling to clear patient- they recommended neurology.  I spoke with our neurology group and they are also unwilling to clear the patient. At this point, I don't think we have any choice but to have him complete MRI.  I have pended orders below.

## 2015-08-12 NOTE — Telephone Encounter (Signed)
Spoke with pt. He reports he has problems sleeping for months.  "I just can't sleep unless I am physically exhausted."

## 2015-08-12 NOTE — Telephone Encounter (Signed)
I am happy to try however I am almost positive they will deny- remind me is his sleep issue insomnia?

## 2015-08-12 NOTE — Telephone Encounter (Signed)
Mason Williams, pt is requesting that we try to run through his insurance with dx of insomnia.  Could you please try?

## 2015-08-12 NOTE — Telephone Encounter (Signed)
Yes I can try, can you put new dx code on order

## 2015-08-12 NOTE — Telephone Encounter (Signed)
Notified pt. He is willing to proceed with MRI and is wanting to know if test can be submitted to his insurance due to his sleep issues and see if it will be covered?  Please advise?

## 2015-08-12 NOTE — Telephone Encounter (Signed)
Relation to [id number]  Call back [phone]   Reason for call:  Patient calling checking on the status of everything. Please follow up with patient.

## 2015-08-15 NOTE — Telephone Encounter (Signed)
Pt called in to f/u on this. Not sure what to tell him we are trying to schedule. Per referral notes on sport med referral pt needs neurology clearance? Pt is stating again that he needs this done ASAP and asking what he needs to do? Who is he going to see? Does he need to schedule MRI? Where? Please f/u on cell #.

## 2015-08-15 NOTE — Telephone Encounter (Signed)
Myself and imaging has called patient, had to leave a message, awaiting return call

## 2015-08-15 NOTE — Telephone Encounter (Signed)
Please let pt know that the insurance approved his MRI- great news!  Candise Bowens, will you please contact patient?

## 2015-08-16 NOTE — Telephone Encounter (Signed)
Pt MRI scheduled for Saturday. Awaiting result and ?letter of clearance.

## 2015-08-20 ENCOUNTER — Ambulatory Visit (HOSPITAL_BASED_OUTPATIENT_CLINIC_OR_DEPARTMENT_OTHER)
Admission: RE | Admit: 2015-08-20 | Discharge: 2015-08-20 | Disposition: A | Discharge status: Home / Self Care | Payer: BLUE CROSS/BLUE SHIELD | Source: Ambulatory Visit | Attending: Family | Admitting: Family

## 2015-08-20 DIAGNOSIS — G4700 Insomnia, unspecified: Secondary | ICD-10-CM | POA: Diagnosis present

## 2015-08-22 NOTE — Telephone Encounter (Signed)
Caller name: Delaney Meigs  Relation to pt: [place] [employer]  Call back number: [phone]    Reason for call:  Requesting you verify MRI dates.

## 2015-08-22 NOTE — Telephone Encounter (Signed)
Phone # begins with [phone] instead of [phone].  Detailed message with MRI date left on Tamara's voicemail.

## 2015-08-22 NOTE — Telephone Encounter (Signed)
Clearance form and MRI result faxed to the Boxing Commission at [phone].

## 2015-08-22 NOTE — Telephone Encounter (Signed)
Notified pt. 

## 2015-08-22 NOTE — Telephone Encounter (Signed)
Clearance form and MRI result faxed to the Boxing Commission at [phone].  Notified pt.

## 2015-09-13 ENCOUNTER — Telehealth: Payer: Self-pay | Admitting: *Deleted

## 2015-09-13 MED ORDER — HYDROCHLOROTHIAZIDE 25 MG PO TABS: 25.0000 mg | ORAL_TABLET | Freq: Every day | ORAL | Status: DC

## 2015-09-13 NOTE — Telephone Encounter (Signed)
Received fax from [facility] requesting refill of HCTZ 25mg  once a day. Refills sent.

## 2015-11-11 ENCOUNTER — Telehealth: Payer: Self-pay | Admitting: Behavioral Health

## 2015-11-11 NOTE — Telephone Encounter (Signed)
Patient cancelled appointment for 11/14/15 and will reschedule at a later date.

## 2015-11-14 ENCOUNTER — Encounter: Payer: Self-pay | Admitting: Family

## 2015-12-19 ENCOUNTER — Encounter: Payer: Self-pay | Admitting: Family

## 2015-12-23 ENCOUNTER — Telehealth: Payer: Self-pay | Admitting: Family

## 2015-12-23 NOTE — Telephone Encounter (Signed)
Pt declined scheduling cpe and flu shot at this time. He states he will call at a later time to schedule.

## 2015-12-23 NOTE — Telephone Encounter (Signed)
health maintenance updated

## 2016-03-03 IMAGING — DX DG KNEE AP/LAT W/ SUNRISE*L*
4 series · 4 of 4 positions shown · non-contrast
Comparison: None.

CLINICAL DATA: Left knee pain, no acute injury, initial encounter

EXAM:
LEFT KNEE 3 VIEWS

[knee lat (1 of 2)]
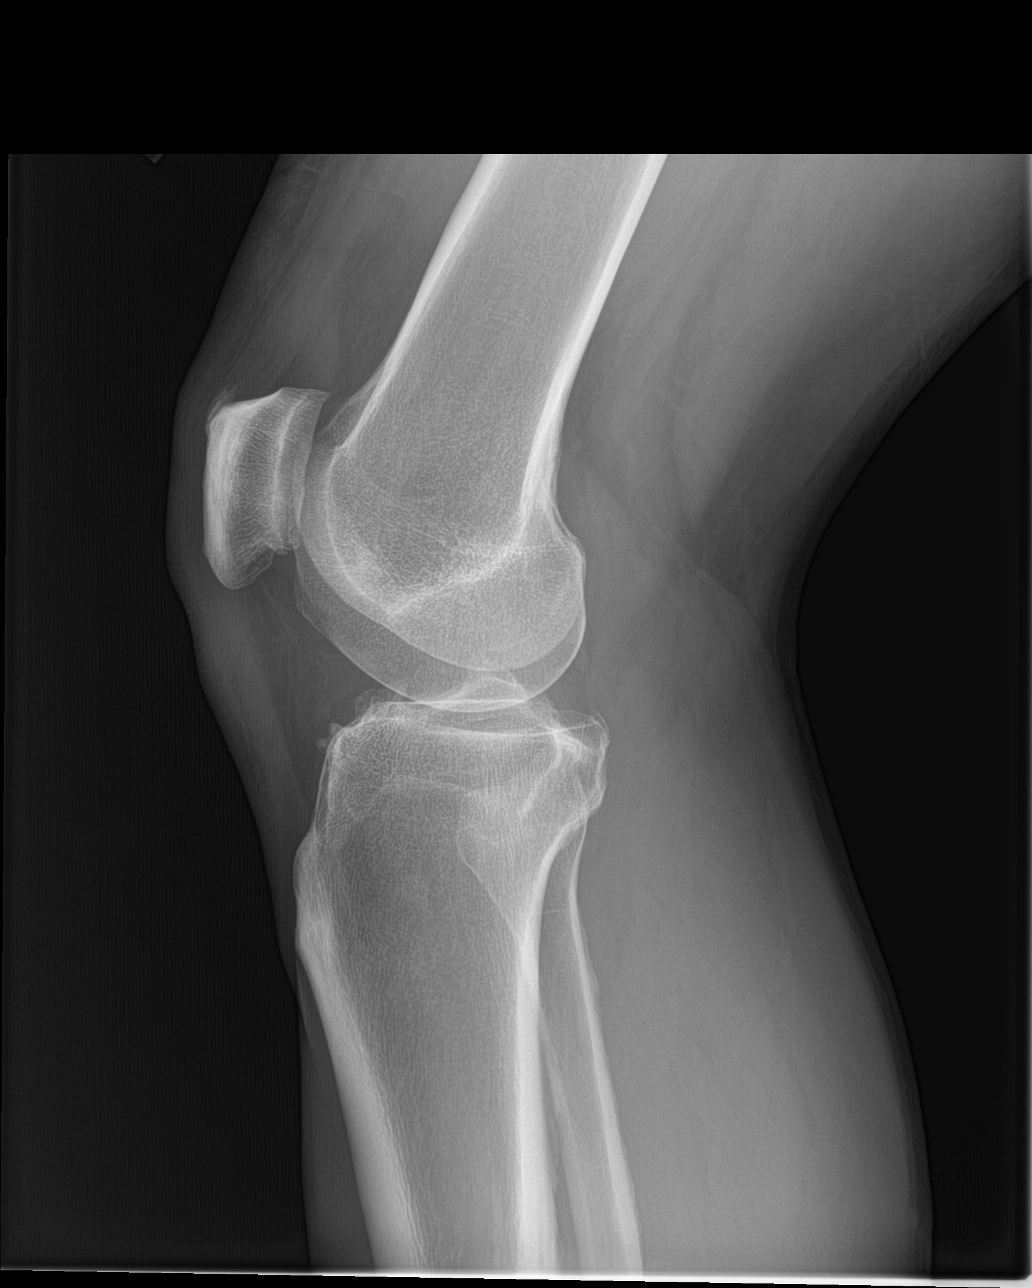

[knee lat (2 of 2)]
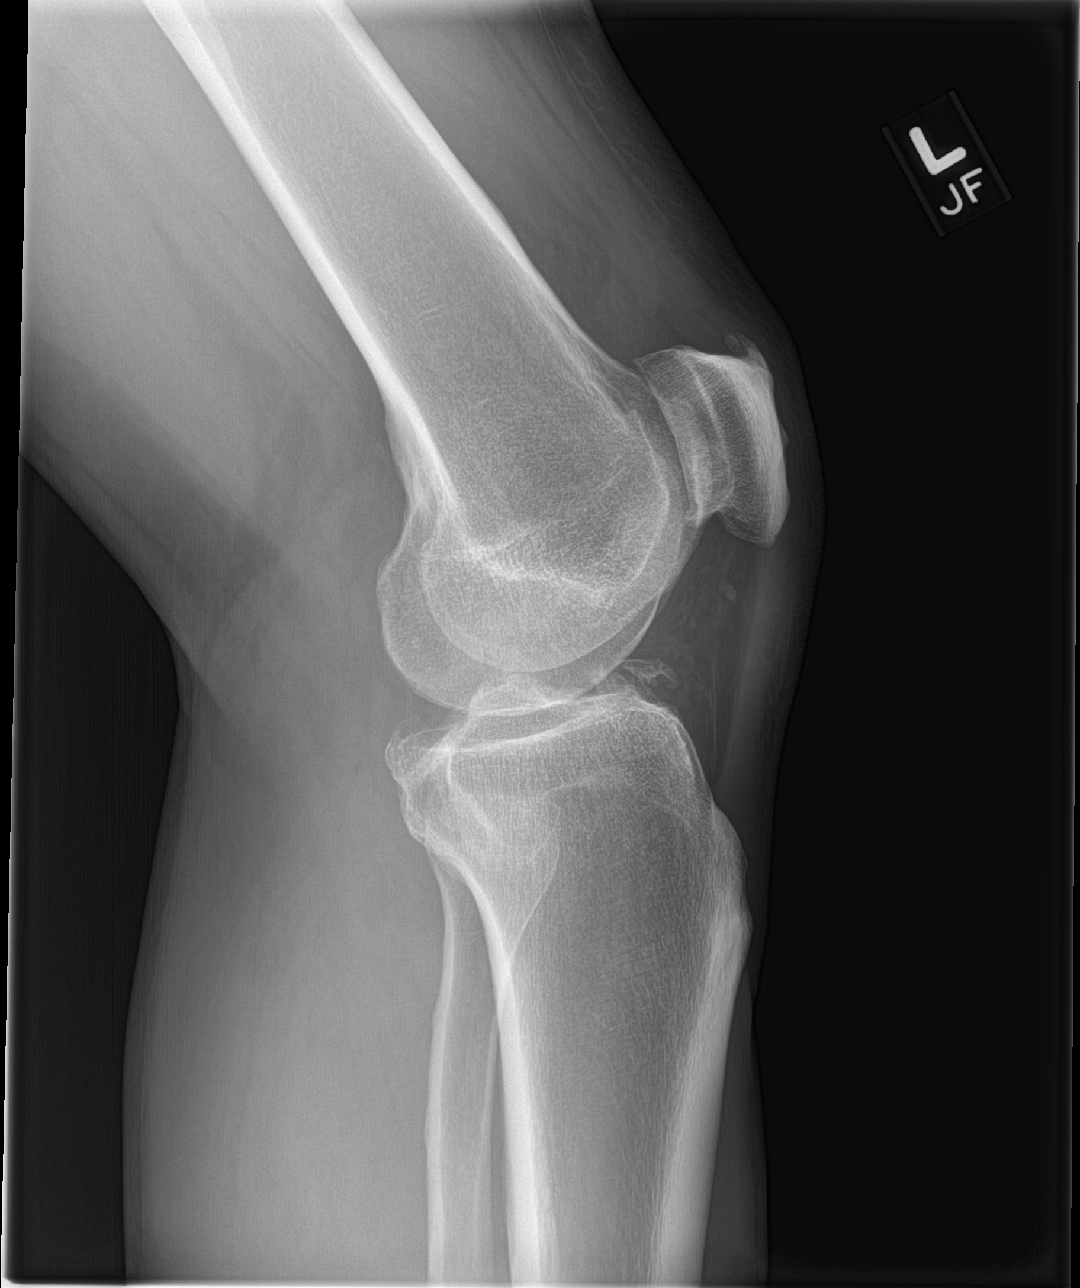

[knee sunrise]
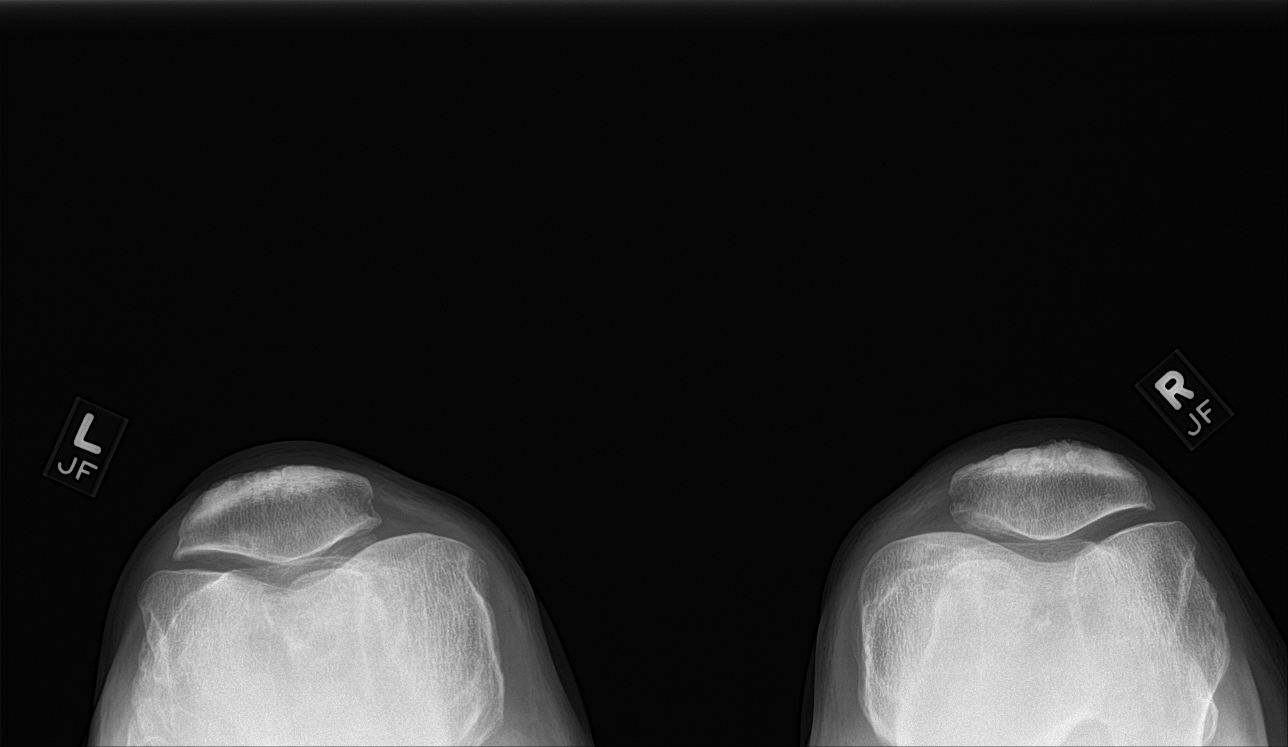

[tunnel]
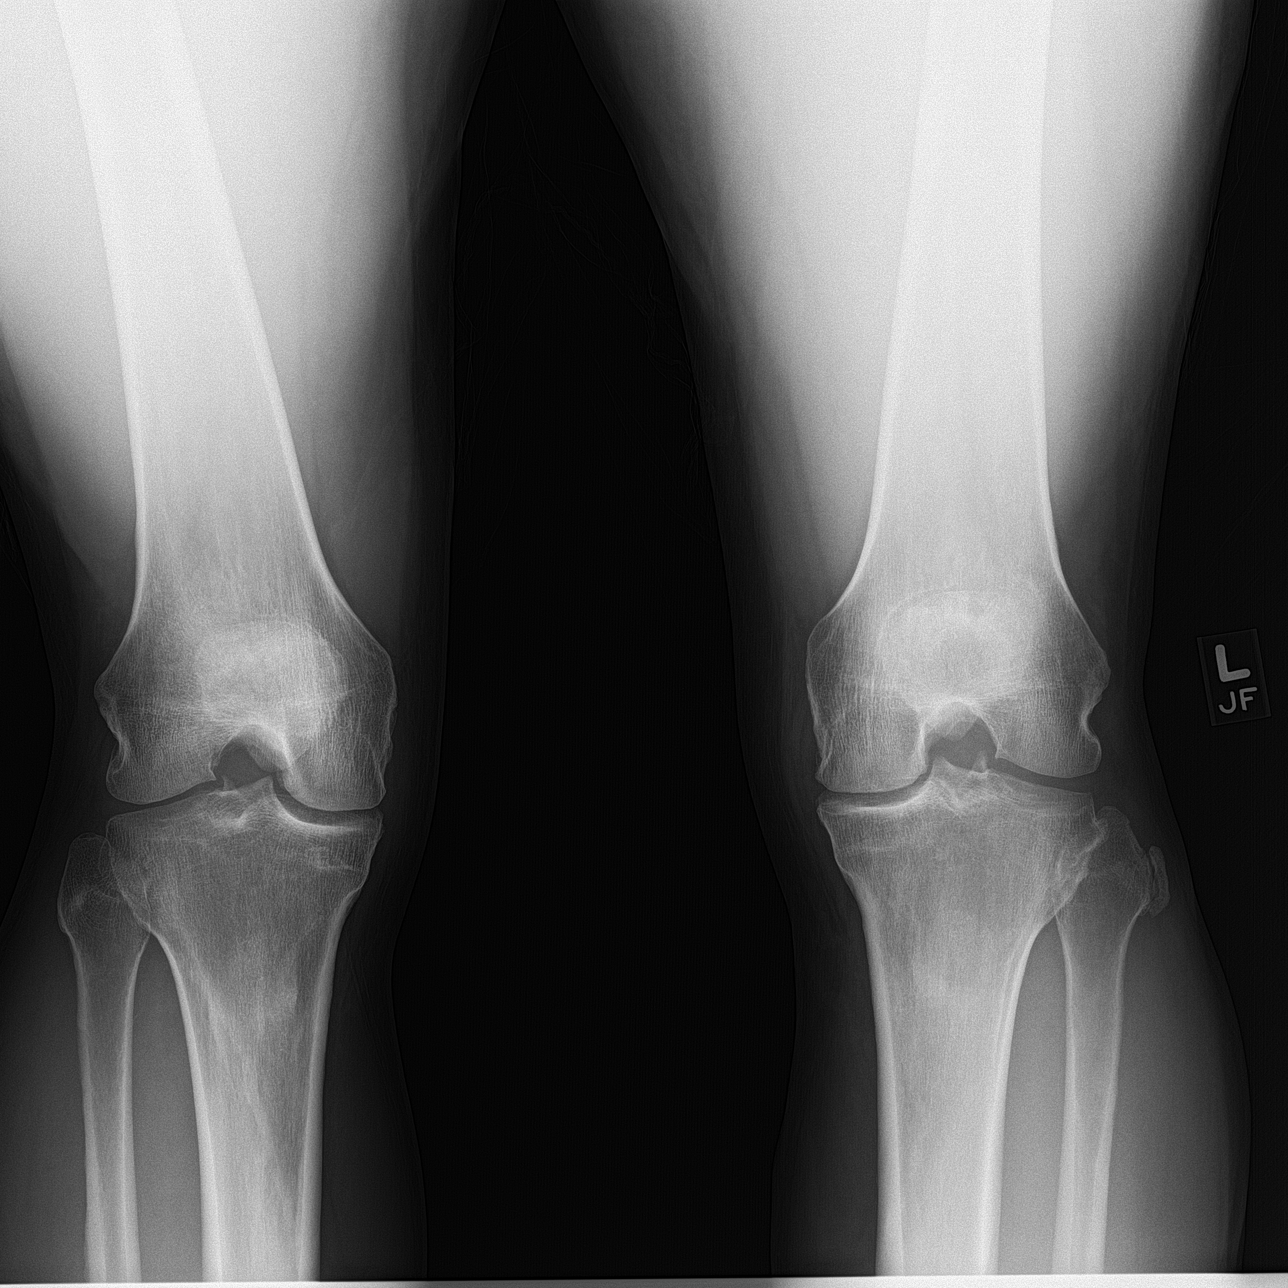

[4 of 4 positions shown; findings below may reference images not displayed]

FINDINGS: Mild degenerative changes are noted particularly in the medial joint
space and patellofemoral joint space. No acute fracture or
dislocation is noted. Some calcifications no adjacent to the head of
the fibula likely related to the lateral collateral ligament.
IMPRESSION: Mild degenerative change without acute abnormality.

## 2019-04-27 DIAGNOSIS — H60333 Swimmer's ear, bilateral: Secondary | ICD-10-CM | POA: Diagnosis not present

## 2019-04-27 DIAGNOSIS — T161XXA Foreign body in right ear, initial encounter: Secondary | ICD-10-CM | POA: Diagnosis not present

## 2019-05-04 DIAGNOSIS — H60333 Swimmer's ear, bilateral: Secondary | ICD-10-CM | POA: Diagnosis not present

## 2019-05-06 DIAGNOSIS — H60333 Swimmer's ear, bilateral: Secondary | ICD-10-CM | POA: Diagnosis not present

## 2019-08-28 DIAGNOSIS — R7309 Other abnormal glucose: Secondary | ICD-10-CM | POA: Diagnosis not present

## 2019-08-28 DIAGNOSIS — I10 Essential (primary) hypertension: Secondary | ICD-10-CM | POA: Diagnosis not present

## 2019-09-18 DIAGNOSIS — R7309 Other abnormal glucose: Secondary | ICD-10-CM | POA: Diagnosis not present

## 2019-09-18 DIAGNOSIS — E785 Hyperlipidemia, unspecified: Secondary | ICD-10-CM | POA: Diagnosis not present

## 2019-09-18 DIAGNOSIS — I10 Essential (primary) hypertension: Secondary | ICD-10-CM | POA: Diagnosis not present

## 2020-01-22 DIAGNOSIS — I10 Essential (primary) hypertension: Secondary | ICD-10-CM | POA: Diagnosis not present

## 2020-02-04 DIAGNOSIS — K219 Gastro-esophageal reflux disease without esophagitis: Secondary | ICD-10-CM | POA: Diagnosis not present

## 2020-02-04 DIAGNOSIS — Z800 Family history of malignant neoplasm of digestive organs: Secondary | ICD-10-CM | POA: Diagnosis not present

## 2020-02-04 DIAGNOSIS — Z1211 Encounter for screening for malignant neoplasm of colon: Secondary | ICD-10-CM | POA: Diagnosis not present

## 2020-02-04 DIAGNOSIS — E669 Obesity, unspecified: Secondary | ICD-10-CM | POA: Diagnosis not present

## 2020-03-04 DIAGNOSIS — D122 Benign neoplasm of ascending colon: Secondary | ICD-10-CM | POA: Diagnosis not present

## 2020-03-04 DIAGNOSIS — Z800 Family history of malignant neoplasm of digestive organs: Secondary | ICD-10-CM | POA: Diagnosis not present

## 2020-03-04 DIAGNOSIS — K635 Polyp of colon: Secondary | ICD-10-CM | POA: Diagnosis not present

## 2020-03-04 DIAGNOSIS — D124 Benign neoplasm of descending colon: Secondary | ICD-10-CM | POA: Diagnosis not present

## 2020-03-04 DIAGNOSIS — Z1211 Encounter for screening for malignant neoplasm of colon: Secondary | ICD-10-CM | POA: Diagnosis not present

## 2020-05-20 DIAGNOSIS — E1169 Type 2 diabetes mellitus with other specified complication: Secondary | ICD-10-CM | POA: Diagnosis not present

## 2020-05-20 DIAGNOSIS — E785 Hyperlipidemia, unspecified: Secondary | ICD-10-CM | POA: Diagnosis not present

## 2020-05-20 DIAGNOSIS — E781 Pure hyperglyceridemia: Secondary | ICD-10-CM | POA: Diagnosis not present

## 2020-05-20 DIAGNOSIS — R7309 Other abnormal glucose: Secondary | ICD-10-CM | POA: Diagnosis not present

## 2022-05-26 ENCOUNTER — Ambulatory Visit (HOSPITAL_COMMUNITY)
Admission: EM | Admit: 2022-05-26 | Discharge: 2022-05-26 | Disposition: A | Discharge status: Home / Self Care | Payer: BC Managed Care – PPO | Attending: Student | Admitting: Student

## 2022-05-26 ENCOUNTER — Encounter (HOSPITAL_COMMUNITY): Payer: Self-pay

## 2022-05-26 DIAGNOSIS — R238 Other skin changes: Secondary | ICD-10-CM | POA: Diagnosis not present

## 2022-05-26 DIAGNOSIS — W57XXXA Bitten or stung by nonvenomous insect and other nonvenomous arthropods, initial encounter: Secondary | ICD-10-CM | POA: Diagnosis not present

## 2022-05-26 DIAGNOSIS — S1096XA Insect bite of unspecified part of neck, initial encounter: Secondary | ICD-10-CM | POA: Diagnosis not present

## 2022-05-26 MED ORDER — TRIAMCINOLONE ACETONIDE 0.1 % EX CREA: 1.0000 | TOPICAL_CREAM | Freq: Two times a day (BID) | CUTANEOUS | 0 refills | Status: AC

## 2022-05-26 MED ORDER — DOXYCYCLINE HYCLATE 100 MG PO CAPS: 100.0000 mg | ORAL_CAPSULE | Freq: Two times a day (BID) | ORAL | 0 refills | Status: AC

## 2022-05-26 NOTE — Discharge Instructions (Addendum)
-  Doxycycline twice daily for 7 days.  Make sure to wear sunscreen while spending time outside while on this medication as it can increase your chance of sunburn. You can take this medication with food if you have a sensitive stomach. -Triamcinolone cream 1-2x daily applied directly to the insect bite  -Benedryl (diphenhydramine) 25-50mg  (1-2 pills) as needed for itching, up to every 6 hours.  This medication will cause drowsiness. -Please check your blood pressure at home or at the pharmacy. If this continues to be >140/90, follow-up with your primary care provider for further blood pressure management/ medication titration. If you develop chest pain, shortness of breath, vision changes, the worst headache of your life- head straight to the ED or call 911.

## 2022-05-26 NOTE — ED Triage Notes (Signed)
Pt reports being insect bite yesterday, He reports some drainage and pain from the bite

## 2022-05-26 NOTE — ED Provider Notes (Signed)
[facility]    CSN: [id number] Arrival date & time: 05/26/22  1218      History   Chief Complaint Chief Complaint  Patient presents with   Insect Bite    HPI Mason Williams is a 61 y.o. male presenting with insect bite to the neck x2 days. History noncontributory. States a winged insect stung his neck. Immediately with pain, now with swelling and drainage.  Denies facial symptoms including swelling, sensation of throat closing, rash.  Denies shortness of breath, chest pain, dizziness, weakness, nausea, vomiting.  Blood pressure was incidentally elevated today, he attributes this to the discomfort from the insect sting, he does not take medications for blood pressure.  Denies headaches, dizziness, chest pain, vision changes, shortness of breath  HPI  Past Medical History:  Diagnosis Date   Arthritis    Depression    admitted to psych 2013 for suicide ideation/drug detox   Drug abuse [facility])     Patient Active Problem List   Diagnosis Date Noted   Routine sports examination 07/08/2015   Elevated blood pressure 07/08/2015   Bilateral knee pain 04/08/2015   Preventative health care 11/14/2014   Depression 07/23/2012   Cocaine abuse with cocaine-induced disorder (HCC) 07/19/2012    Past Surgical History:  Procedure Laterality Date   TONSILLECTOMY  1977       Home Medications    Prior to Admission medications   Medication Sig Start Date End Date Taking? Authorizing Provider  doxycycline (VIBRAMYCIN) 100 MG capsule Take 1 capsule (100 mg total) by mouth 2 (two) times daily for 7 days. 05/26/22 06/02/22 Yes Rhys Martini, PA-C  triamcinolone cream (KENALOG) 0.1 % Apply 1 Application topically 2 (two) times daily for 7 days. 05/26/22 06/02/22 Yes Rhys Martini, PA-C  hydrochlorothiazide (HYDRODIURIL) 25 MG tablet Take 1 tablet (25 mg total) by mouth daily. 09/13/15   Sandford Craze, NP  meloxicam (MOBIC) 7.5 MG tablet Take 1 tablet (7.5 mg total) by mouth daily.  07/08/15   Sandford Craze, NP  potassium chloride SA (K-DUR,KLOR-CON) 20 MEQ tablet Take 1 tablet (20 mEq total) by mouth daily. 07/12/15   Sandford Craze, NP    Family History Family History  Problem Relation Age of Onset   Diabetes Mother    Heart disease Maternal Grandmother    Diabetes Maternal Grandfather     Social History Social History   Tobacco Use   Smoking status: Former  Substance Use Topics   Alcohol use: No    Alcohol/week: 0.0 standard drinks of alcohol    Comment: former alcoholic   Drug use: No    Types: Cocaine, Marijuana    Comment: former     Allergies   Aspirin and Penicillins   Review of Systems Review of Systems  Constitutional:  Negative for appetite change, chills and fever.  HENT:  Negative for congestion, ear pain, rhinorrhea, sinus pressure, sinus pain and sore throat.   Eyes:  Negative for redness and visual disturbance.  Respiratory:  Negative for cough, chest tightness, shortness of breath and wheezing.   Cardiovascular:  Negative for chest pain and palpitations.  Gastrointestinal:  Negative for abdominal pain, constipation, diarrhea, nausea and vomiting.  Genitourinary:  Negative for dysuria, frequency and urgency.  Musculoskeletal:  Negative for myalgias.  Skin:  Positive for color change.  Neurological:  Negative for dizziness, weakness and headaches.  Psychiatric/Behavioral:  Negative for confusion.   All other systems reviewed and are negative.    Physical Exam Triage Vital  Signs ED Triage Vitals [05/26/22 1416]  Enc Vitals Group     BP (!) 170/122     Pulse Rate 87     Resp 18     Temp 97.6 F (36.4 C)     Temp Source Oral     SpO2 95 %     Weight      Height      Head Circumference      Peak Flow      Pain Score      Pain Loc      Pain Edu?      Excl. in GC?    No data found.  Updated Vital Signs BP (!) 170/122 (BP Location: Left Arm)   Pulse 87   Temp 97.6 F (36.4 C) (Oral)   Resp 18   SpO2 95%    Visual Acuity Right Eye Distance:   Left Eye Distance:   Bilateral Distance:    Right Eye Near:   Left Eye Near:    Bilateral Near:     Physical Exam Vitals reviewed.  Constitutional:      General: He is not in acute distress.    Appearance: Normal appearance. He is not ill-appearing or diaphoretic.  HENT:     Head: Normocephalic and atraumatic.  Cardiovascular:     Rate and Rhythm: Normal rate and regular rhythm.     Heart sounds: Normal heart sounds.  Pulmonary:     Effort: Pulmonary effort is normal.     Breath sounds: Normal breath sounds.  Skin:    General: Skin is warm.     Comments: See image below Posterior neck with localized inflammation and erythema at the location of insect bite. Exquisitely TTP centrally with area of firm induration. Serous drainage. No facial involvement. Airway patent.  Neurological:     General: No focal deficit present.     Mental Status: He is alert and oriented to person, place, and time.  Psychiatric:        Mood and Affect: Mood normal.        Behavior: Behavior normal.        Thought Content: Thought content normal.        Judgment: Judgment normal.       UC Treatments / Results  Labs (all labs ordered are listed, but only abnormal results are displayed) Labs Reviewed - No data to display  EKG   Radiology No results found.  Procedures Procedures (including critical care time)  Medications Ordered in UC Medications - No data to display  Initial Impression / Assessment and Plan / UC Course  I have reviewed the triage vital signs and the nursing notes.  Pertinent labs & imaging results that were available during my care of the patient were reviewed by me and considered in my medical decision making (see chart for details).     This patient is a very pleasant 60 y.o. year old male presenting with insect bite x2 days. Afebrile, nontachy.   I do have concern for early cellulitis given area of firm induration  centrally.  He is penicillin allergic, so doxycycline sent.  Warned of photosensitivity risk.  Also sent triamcinolone cream.  Recommended Benadryl over-the-counter.  His blood pressure is significantly elevated at 170/122 today, so refrained from prednisone p.o.  He attributes the elevated blood pressure to discomfort, and denies headaches, dizziness, chest pain, vision changes.  Monitor this at home, he is in agreement.   ED return precautions discussed. Patient verbalizes  understanding and agreement.    Final Clinical Impressions(s) / UC Diagnoses   Final diagnoses:  Change of skin color  Insect bite of neck, initial encounter     Discharge Instructions      -Doxycycline twice daily for 7 days.  Make sure to wear sunscreen while spending time outside while on this medication as it can increase your chance of sunburn. You can take this medication with food if you have a sensitive stomach. -Triamcinolone cream 1-2x daily applied directly to the insect bite  -Benedryl (diphenhydramine) 25-50mg  (1-2 pills) as needed for itching, up to every 6 hours.  This medication will cause drowsiness. -Please check your blood pressure at home or at the pharmacy. If this continues to be >140/90, follow-up with your primary care provider for further blood pressure management/ medication titration. If you develop chest pain, shortness of breath, vision changes, the worst headache of your life- head straight to the ED or call 911.    ED Prescriptions     Medication Sig Dispense Auth. Provider   triamcinolone cream (KENALOG) 0.1 % Apply 1 Application topically 2 (two) times daily for 7 days. 30 g Rhys Martini, PA-C   doxycycline (VIBRAMYCIN) 100 MG capsule Take 1 capsule (100 mg total) by mouth 2 (two) times daily for 7 days. 14 capsule Rhys Martini, PA-C      PDMP not reviewed this encounter.   Rhys Martini, PA-C 05/26/22 1440

## 2024-05-21 ENCOUNTER — Emergency Department (HOSPITAL_BASED_OUTPATIENT_CLINIC_OR_DEPARTMENT_OTHER): Admitting: Radiology

## 2024-05-21 ENCOUNTER — Encounter (HOSPITAL_BASED_OUTPATIENT_CLINIC_OR_DEPARTMENT_OTHER): Payer: Self-pay

## 2024-05-21 ENCOUNTER — Emergency Department (HOSPITAL_BASED_OUTPATIENT_CLINIC_OR_DEPARTMENT_OTHER)
Admission: EM | Admit: 2024-05-21 | Discharge: 2024-05-21 | Disposition: A | Discharge status: Home / Self Care | Payer: Worker's Compensation | Attending: Emergency Medicine | Admitting: Emergency Medicine

## 2024-05-21 ENCOUNTER — Emergency Department (HOSPITAL_BASED_OUTPATIENT_CLINIC_OR_DEPARTMENT_OTHER)

## 2024-05-21 ENCOUNTER — Other Ambulatory Visit: Payer: Self-pay

## 2024-05-21 DIAGNOSIS — S060X0A Concussion without loss of consciousness, initial encounter: Secondary | ICD-10-CM | POA: Diagnosis not present

## 2024-05-21 DIAGNOSIS — M25462 Effusion, left knee: Secondary | ICD-10-CM | POA: Insufficient documentation

## 2024-05-21 DIAGNOSIS — Y990 Civilian activity done for income or pay: Secondary | ICD-10-CM | POA: Insufficient documentation

## 2024-05-21 DIAGNOSIS — S0990XA Unspecified injury of head, initial encounter: Secondary | ICD-10-CM | POA: Diagnosis present

## 2024-05-21 DIAGNOSIS — Z79899 Other long term (current) drug therapy: Secondary | ICD-10-CM | POA: Diagnosis not present

## 2024-05-21 DIAGNOSIS — M25431 Effusion, right wrist: Secondary | ICD-10-CM | POA: Diagnosis not present
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
## 2024-05-21 NOTE — Discharge Instructions (Signed)
 You were examined today for a head injury and possible concussion.  Your Head CT and X-rays were normal today. It is important to allow your brain to rest and recover, limit screen time and strenuous activity.  Sometimes serious problems can develop after a head injury. Please return to the emergency department if you experience any of the following symptoms: Repeated vomiting Headache that gets worse and does not go away Loss of consciousness or inability to stay awake at times when you normally would be able to Getting more confused, restless or agitated Convulsions or seizures Difficulty walking or feeling off balance Weakness or numbness Vision changes A concussion is a very mild traumatic brain injury caused by a bump, jolt or blow to the head, most people recover quickly and fully. You can experience a wide variety of symptoms including:   - Confusion      - Difficulty concentrating       - Trouble remembering new info  - Headache      - Dizziness        - Fuzzy or blurry vision  - Fatigue      - Balance problems      - Light sensitivity  - Mood swings     - Changes in sleep or difficulty sleeping   To help these symptoms improve make sure you are getting plenty of rest, avoid screen time, loud music and strenuous mental activities. Avoid any strenuous physical activities, once your symptoms have resolved a slow and gradual return to activity is recommended. It is very important that you avoid situations in which you might sustain a second head injury as this can be very dangerous and life threatening. You cannot be medically cleared to return to normal activities until you have followed up with your primary doctor or a concussion specialist for reevaluation.

## 2024-05-21 NOTE — ED Notes (Signed)
 Patient transported to CT

## 2024-05-21 NOTE — ED Notes (Signed)
Out for scans

## 2024-08-11 ENCOUNTER — Ambulatory Visit (INDEPENDENT_AMBULATORY_CARE_PROVIDER_SITE_OTHER)

## 2024-08-11 ENCOUNTER — Encounter (HOSPITAL_COMMUNITY): Payer: Self-pay

## 2024-08-11 ENCOUNTER — Ambulatory Visit (HOSPITAL_COMMUNITY)
Admission: EM | Admit: 2024-08-11 | Discharge: 2024-08-11 | Disposition: A | Discharge status: Home / Self Care | Payer: Self-pay | Attending: Emergency Medicine | Admitting: Emergency Medicine

## 2024-08-11 DIAGNOSIS — M25562 Pain in left knee: Secondary | ICD-10-CM

## 2024-08-11 DIAGNOSIS — M25512 Pain in left shoulder: Secondary | ICD-10-CM

## 2024-08-11 DIAGNOSIS — M545 Low back pain, unspecified: Secondary | ICD-10-CM

## 2024-08-11 MED ORDER — IBUPROFEN 600 MG PO TABS: 600.0000 mg | ORAL_TABLET | Freq: Four times a day (QID) | ORAL | 0 refills | Status: AC | PRN

## 2024-08-11 MED ORDER — BACLOFEN 5 MG PO TABS: 5.0000 mg | ORAL_TABLET | Freq: Every evening | ORAL | 0 refills | Status: AC
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
## 2024-08-11 NOTE — Discharge Instructions (Addendum)
 Xray of knee and shoulder do not show any abnormalities of the bones! You likely have soft tissue injuries.  Ibuprofen  can be used every 6 hours for pain You can also alternate with tylenol   Baclofen  is a muscle relaxer that you can take at bedtime. (It might make you drowsy)  Ice and elevate the areas of pain Use the knee brace for support and compression  Allow 4-5 days for improvement Please follow with your primary care provider if symptoms are not improving.
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
## 2024-08-18 ENCOUNTER — Other Ambulatory Visit: Payer: Self-pay

## 2024-08-18 ENCOUNTER — Emergency Department (HOSPITAL_COMMUNITY)
Admission: EM | Admit: 2024-08-18 | Discharge: 2024-08-19 | Disposition: A | Discharge status: Home / Self Care | Attending: Emergency Medicine | Admitting: Emergency Medicine

## 2024-08-18 ENCOUNTER — Encounter (HOSPITAL_COMMUNITY): Payer: Self-pay

## 2024-08-18 DIAGNOSIS — M25562 Pain in left knee: Secondary | ICD-10-CM | POA: Insufficient documentation
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
## 2024-08-19 MED ORDER — DICLOFENAC SODIUM 1 % EX GEL: 2.0000 g | Freq: Four times a day (QID) | CUTANEOUS | 0 refills | Status: AC

## 2024-08-19 NOTE — Discharge Instructions (Signed)
 Apply Voltaren  gel to the area. Follow up with your doctor for recheck and further planning.
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
## 2024-08-26 ENCOUNTER — Other Ambulatory Visit: Payer: Self-pay | Admitting: Internal Medicine

## 2024-08-26 DIAGNOSIS — G44309 Post-traumatic headache, unspecified, not intractable: Secondary | ICD-10-CM

## 2024-09-03 ENCOUNTER — Ambulatory Visit
Admission: RE | Admit: 2024-09-03 | Discharge: 2024-09-03 | Disposition: A | Discharge status: Home / Self Care | Payer: Self-pay | Source: Ambulatory Visit | Attending: Internal Medicine | Admitting: Internal Medicine

## 2024-09-03 DIAGNOSIS — G44309 Post-traumatic headache, unspecified, not intractable: Secondary | ICD-10-CM
